# Patient Record
Sex: Male | Born: 1965
Health system: Southern US, Community
[De-identification: ages and names within clinical notes are randomized; demographics above are authoritative.]

## PROBLEM LIST (undated history)

## (undated) DIAGNOSIS — N200 Calculus of kidney: Secondary | ICD-10-CM

---

## 2017-12-03 ENCOUNTER — Ambulatory Visit (HOSPITAL_COMMUNITY)
Admission: EM | Admit: 2017-12-03 | Discharge: 2017-12-03 | Disposition: A | Discharge status: Home / Self Care | Payer: Self-pay | Attending: Urgent Care | Admitting: Urgent Care

## 2017-12-03 ENCOUNTER — Encounter (HOSPITAL_COMMUNITY): Payer: Self-pay | Admitting: Emergency Medicine

## 2017-12-03 ENCOUNTER — Ambulatory Visit (INDEPENDENT_AMBULATORY_CARE_PROVIDER_SITE_OTHER): Payer: Self-pay

## 2017-12-03 DIAGNOSIS — F17290 Nicotine dependence, other tobacco product, uncomplicated: Secondary | ICD-10-CM | POA: Insufficient documentation

## 2017-12-03 DIAGNOSIS — R0789 Other chest pain: Secondary | ICD-10-CM | POA: Insufficient documentation

## 2017-12-03 DIAGNOSIS — R9431 Abnormal electrocardiogram [ECG] [EKG]: Secondary | ICD-10-CM

## 2017-12-03 DIAGNOSIS — I491 Atrial premature depolarization: Secondary | ICD-10-CM | POA: Insufficient documentation

## 2017-12-03 DIAGNOSIS — M25511 Pain in right shoulder: Secondary | ICD-10-CM | POA: Insufficient documentation

## 2017-12-03 DIAGNOSIS — R109 Unspecified abdominal pain: Secondary | ICD-10-CM

## 2017-12-03 DIAGNOSIS — Z87442 Personal history of urinary calculi: Secondary | ICD-10-CM | POA: Insufficient documentation

## 2017-12-03 DIAGNOSIS — N200 Calculus of kidney: Secondary | ICD-10-CM

## 2017-12-03 HISTORY — DX: Calculus of kidney: N20.0

## 2017-12-03 LAB — POCT URINALYSIS DIP (DEVICE)
BILIRUBIN URINE: NEGATIVE
Glucose, UA: NEGATIVE mg/dL
Ketones, ur: NEGATIVE mg/dL
LEUKOCYTES UA: NEGATIVE
NITRITE: NEGATIVE
Protein, ur: 100 mg/dL — AB
Specific Gravity, Urine: 1.015 (ref 1.005–1.030)
Urobilinogen, UA: 0.2 mg/dL (ref 0.0–1.0)
pH: 6 (ref 5.0–8.0)

## 2017-12-03 LAB — POCT I-STAT, CHEM 8
BUN: 8 mg/dL (ref 6–20)
CHLORIDE: 102 mmol/L (ref 101–111)
Calcium, Ion: 1.02 mmol/L — ABNORMAL LOW (ref 1.15–1.40)
Creatinine, Ser: 0.9 mg/dL (ref 0.61–1.24)
Glucose, Bld: 89 mg/dL (ref 65–99)
HEMATOCRIT: 54 % — AB (ref 39.0–52.0)
Hemoglobin: 18.4 g/dL — ABNORMAL HIGH (ref 13.0–17.0)
POTASSIUM: 4 mmol/L (ref 3.5–5.1)
SODIUM: 135 mmol/L (ref 135–145)
TCO2: 23 mmol/L (ref 22–32)

## 2017-12-03 MED ORDER — HYDROCODONE-ACETAMINOPHEN 5-325 MG PO TABS: 2.0000 | ORAL_TABLET | Freq: Four times a day (QID) | ORAL | 0 refills | Status: DC | PRN

## 2017-12-03 MED ORDER — TAMSULOSIN HCL 0.4 MG PO CAPS: 0.4000 mg | ORAL_CAPSULE | Freq: Every day | ORAL | 1 refills | Status: DC

## 2017-12-03 NOTE — Discharge Instructions (Addendum)
Make sure you check in with your PCP about a referral to urology for evaluation of kidney stones. They should also help you with blood work for your liver and gallbladder enzymes. Your pain may be related to gall stones or your liver. For now, make sure you hydrate very well with at least 2 liters (1 gallon) of water daily. Use Flomax to help you urinate better. Take hydrocodone for severe pain.

## 2017-12-03 NOTE — ED Provider Notes (Addendum)
MRN: [id number] DOB: 1966-10-04  Subjective:   Alex Glover is a 52 y.o. male presenting for 2 day history of right flank pain. Pain is sharp, intermittent, worse with movement, talking, deep breaths, is rated ~8/10. Also has upper right chest/shoulder pain, rated ~6/10. Chest pain feels like a spasm type sensation. Has had sweats, chills, shakes. Has tried ibuprofen with some relief. Denies fever, n/v, abdominal pain, dysuria, hematuria. Admits history of renal stone ~18cm in left kidney, found 2015. Has a urologist, was supposed to have lithotripsy performed but he did not follow up. Smokes cigars occasionally. Has occasional alcohol drink. Hydrates aggressively. Denies family history of heart disease, MI. Denies history of diabetes. Alex Glover controlled substance database does not show patient to be high risk.  Alex Glover has No Known Allergies.  Alex Glover  has a past medical history of Kidney stone. Denies past surgical history.  Objective:   Vitals: BP 134/89   Pulse 97   Temp 99.6 F (37.6 C) (Oral)   Resp 16   Wt 195 lb (88.5 kg)   SpO2 100%   Physical Exam  Constitutional: He is oriented to person, place, and time. He appears well-developed and well-nourished.  HENT:  Mouth/Throat: Oropharynx is clear and moist.  Cardiovascular: Normal rate, regular rhythm and intact distal pulses. Exam reveals no gallop and no friction rub.  No murmur heard. Pulmonary/Chest: No respiratory distress. He has no wheezes. He has no rales.  Abdominal: Soft. Bowel sounds are normal. He exhibits no distension and no mass. There is no tenderness. There is no guarding.  Right flank pain. CVA tenderness.  Neurological: He is alert and oriented to person, place, and time.  Skin: Skin is warm and dry.   Dg Abd 1 View  Result Date: 12/03/2017 CLINICAL DATA:  52 year old male with right flank pain for 3 days. Known left nephrolithiasis. EXAM: ABDOMEN - 1 VIEW COMPARISON:  None. FINDINGS: Two supine views. Clustered  calculi projecting over the left renal shadow individually measure up to 13 millimeters in diameter. No definite right nephrolithiasis. The right renal contour appears normal. Negative visible lung bases and nonobstructed bowel-gas pattern. No osseous abnormality identified. IMPRESSION: 1. Clustered and bulky left nephrolithiasis. No right side urologic calculus identified radiographically. 2. Normal bowel gas pattern. Electronically Signed   By: Genevie Ann M.D.   On: 12/03/2017 12:41   Results for orders placed or performed during the hospital encounter of 12/03/17 (from the past 24 hour(s))  POCT urinalysis dip (device)     Status: Abnormal   Collection Time: 12/03/17 11:27 AM  Result Value Ref Range   Glucose, UA NEGATIVE NEGATIVE mg/dL   Bilirubin Urine NEGATIVE NEGATIVE   Ketones, ur NEGATIVE NEGATIVE mg/dL   Specific Gravity, Urine 1.015 1.005 - 1.030   Hgb urine dipstick TRACE (A) NEGATIVE   pH 6.0 5.0 - 8.0   Protein, ur 100 (A) NEGATIVE mg/dL   Urobilinogen, UA 0.2 0.0 - 1.0 mg/dL   Nitrite NEGATIVE NEGATIVE   Leukocytes, UA NEGATIVE NEGATIVE  I-STAT, chem 8     Status: Abnormal   Collection Time: 12/03/17 12:24 PM  Result Value Ref Range   Sodium 135 135 - 145 mmol/L   Potassium 4.0 3.5 - 5.1 mmol/L   Chloride 102 101 - 111 mmol/L   BUN 8 6 - 20 mg/dL   Creatinine, Ser 0.90 0.61 - 1.24 mg/dL   Glucose, Bld 89 65 - 99 mg/dL   Calcium, Ion 1.02 (L) 1.15 - 1.40 mmol/L  TCO2 23 22 - 32 mmol/L   Hemoglobin 18.4 (H) 13.0 - 17.0 g/dL   HCT 54.0 (H) 39.0 - 52.0 %   ED ECG REPORT   Date: 12/03/2017  Rate: 92bpm  Rhythm: normal sinus rhythm  QRS Axis: normal  Intervals: normal  ST/T Wave abnormalities: T-wave inversion in Lead III  Conduction Disutrbances:none  Narrative Interpretation: Non-specific T-wave inversion as above but in sinus rhythm without acute findings.  Old EKG Reviewed: none available  I have personally reviewed the EKG tracing and agree with the computerized  printout as noted.   Assessment and Plan :   Flank pain  Renal stone  Atypical chest pain  Nonspecific abnormal electrocardiogram (ECG) (EKG)  Case discussed with Dr. Joseph Art.  Will manage for possible renal stone. I counseled patient that CT renal study or ultrasound is best for identifying a renal stone but he will try to obtain referral to urology from his PCP to evaluate this. Also counseled that he needs to have LFTs done to r/o gallbladder, liver as a source for his pain. Also counseled that he needs follow up for his abnormal ecg. Return-to-clinic precautions discussed, patient verbalized understanding.     Jaynee Eagles, PA-C 12/03/17 1308

## 2017-12-03 NOTE — ED Triage Notes (Signed)
PT reports right flank pain for 2 days. PT has had a kidney stone in the past. PT reports urine has been darker. PT also reports pain over right  Chest. PT report pain with deep breaths.

## 2017-12-04 LAB — URINE CULTURE: Culture: NO GROWTH

## 2020-02-25 ENCOUNTER — Ambulatory Visit: Payer: Self-pay | Attending: Internal Medicine

## 2020-02-25 DIAGNOSIS — Z23 Encounter for immunization: Secondary | ICD-10-CM

## 2020-02-25 NOTE — Progress Notes (Signed)
   Covid-19 Vaccination Clinic  Name:  Alex Glover    MRN: [id number] DOB: 1966-10-05  02/25/2020  [name] was observed post Covid-19 immunization for 15 minutes without incident. He was provided with Vaccine Information Sheet and instruction to access the V-Safe system.   [name] was instructed to call 911 with any severe reactions post vaccine: Marland Kitchen Difficulty breathing  . Swelling of face and throat  . A fast heartbeat  . A bad rash all over body  . Dizziness and weakness   Immunizations Administered    Name Date Dose VIS Date Route   Pfizer COVID-19 Vaccine 02/25/2020  4:35 PM 0.3 mL 12/10/2018 Intramuscular   Manufacturer: [place]   Lot: [id number]   [place]: [id number]

## 2020-03-22 ENCOUNTER — Ambulatory Visit: Payer: Self-pay | Attending: Internal Medicine

## 2020-03-22 DIAGNOSIS — Z23 Encounter for immunization: Secondary | ICD-10-CM

## 2020-03-22 NOTE — Progress Notes (Signed)
   Covid-19 Vaccination Clinic  Name:  Alex Glover    MRN: [id number] DOB: 1966-09-11  03/22/2020  [name] was observed post Covid-19 immunization for 15 minutes without incident. He was provided with Vaccine Information Sheet and instruction to access the V-Safe system.   [name] was instructed to call 911 with any severe reactions post vaccine: Marland Kitchen Difficulty breathing  . Swelling of face and throat  . A fast heartbeat  . A bad rash all over body  . Dizziness and weakness   Immunizations Administered    Name Date Dose VIS Date Route   Pfizer COVID-19 Vaccine 03/22/2020 10:49 AM 0.3 mL 12/10/2018 Intramuscular   Manufacturer: [employer], [employer]   Lot: [id number]   [place]: [id number]

## 2020-03-22 NOTE — Progress Notes (Signed)
   Covid-19 Vaccination Clinic  Name:  Alex Glover    MRN: [id number] DOB: March 22, 1966  03/22/2020  [name] was observed post Covid-19 immunization for 15 minutes without incident. He was provided with Vaccine Information Sheet and instruction to access the V-Safe system.   [name] was instructed to call 911 with any severe reactions post vaccine: Marland Kitchen Difficulty breathing  . Swelling of face and throat  . A fast heartbeat  . A bad rash all over body  . Dizziness and weakness   Immunizations Administered    Name Date Dose VIS Date Route   Pfizer COVID-19 Vaccine 03/22/2020 10:49 AM 0.3 mL 12/10/2018 Intramuscular   Manufacturer: [employer], [employer]   Lot: [id number]   [place]: [id number]

## 2020-10-22 ENCOUNTER — Ambulatory Visit: Payer: Self-pay | Attending: Internal Medicine

## 2020-10-22 DIAGNOSIS — Z23 Encounter for immunization: Secondary | ICD-10-CM

## 2020-10-22 NOTE — Progress Notes (Signed)
   Covid-19 Vaccination Clinic  Name:  Alex Glover    MRN: [id number] DOB: May 21, 1966  10/22/2020  Alex Glover was observed post Covid-19 immunization for 15 minutes without incident. He was provided with Vaccine Information Sheet and instruction to access the V-Safe system.   Alex Glover was instructed to call 911 with any severe reactions post vaccine: Marland Kitchen Difficulty breathing  . Swelling of face and throat  . A fast heartbeat  . A bad rash all over body  . Dizziness and weakness   Immunizations Administered    Name Date Dose VIS Date Route   Pfizer COVID-19 Vaccine 10/22/2020  5:15 PM 0.3 mL 08/04/2020 Intramuscular   Manufacturer: [place]   Lot: [id number]   [place]: [address]-1

## 2020-11-04 ENCOUNTER — Ambulatory Visit (HOSPITAL_COMMUNITY)
Admission: EM | Admit: 2020-11-04 | Discharge: 2020-11-04 | Disposition: A | Discharge status: Home / Self Care | Payer: Medicare Other

## 2020-11-04 ENCOUNTER — Encounter (HOSPITAL_COMMUNITY): Payer: Self-pay

## 2020-11-04 ENCOUNTER — Emergency Department (HOSPITAL_BASED_OUTPATIENT_CLINIC_OR_DEPARTMENT_OTHER): Payer: Medicare Other

## 2020-11-04 ENCOUNTER — Other Ambulatory Visit: Payer: Self-pay

## 2020-11-04 ENCOUNTER — Emergency Department (HOSPITAL_COMMUNITY): Payer: Medicare Other

## 2020-11-04 ENCOUNTER — Inpatient Hospital Stay (HOSPITAL_COMMUNITY)
Admission: EM | Admit: 2020-11-04 | Discharge: 2020-11-06 | DRG: 176 | Disposition: A | Discharge status: Home / Self Care | Payer: Medicare Other | Attending: Internal Medicine | Admitting: Internal Medicine

## 2020-11-04 DIAGNOSIS — M7989 Other specified soft tissue disorders: Secondary | ICD-10-CM | POA: Diagnosis not present

## 2020-11-04 DIAGNOSIS — Z20822 Contact with and (suspected) exposure to covid-19: Secondary | ICD-10-CM | POA: Diagnosis present

## 2020-11-04 DIAGNOSIS — F17200 Nicotine dependence, unspecified, uncomplicated: Secondary | ICD-10-CM | POA: Diagnosis present

## 2020-11-04 DIAGNOSIS — I82462 Acute embolism and thrombosis of left calf muscular vein: Secondary | ICD-10-CM | POA: Diagnosis not present

## 2020-11-04 DIAGNOSIS — I82452 Acute embolism and thrombosis of left peroneal vein: Secondary | ICD-10-CM | POA: Diagnosis present

## 2020-11-04 DIAGNOSIS — R0989 Other specified symptoms and signs involving the circulatory and respiratory systems: Secondary | ICD-10-CM | POA: Diagnosis not present

## 2020-11-04 DIAGNOSIS — R55 Syncope and collapse: Secondary | ICD-10-CM | POA: Diagnosis present

## 2020-11-04 DIAGNOSIS — I82432 Acute embolism and thrombosis of left popliteal vein: Secondary | ICD-10-CM | POA: Diagnosis present

## 2020-11-04 DIAGNOSIS — I2609 Other pulmonary embolism with acute cor pulmonale: Secondary | ICD-10-CM | POA: Diagnosis not present

## 2020-11-04 DIAGNOSIS — R609 Edema, unspecified: Secondary | ICD-10-CM

## 2020-11-04 DIAGNOSIS — E041 Nontoxic single thyroid nodule: Secondary | ICD-10-CM | POA: Diagnosis present

## 2020-11-04 DIAGNOSIS — I2699 Other pulmonary embolism without acute cor pulmonale: Principal | ICD-10-CM | POA: Diagnosis present

## 2020-11-04 DIAGNOSIS — I82409 Acute embolism and thrombosis of unspecified deep veins of unspecified lower extremity: Secondary | ICD-10-CM | POA: Diagnosis present

## 2020-11-04 DIAGNOSIS — I248 Other forms of acute ischemic heart disease: Secondary | ICD-10-CM | POA: Diagnosis not present

## 2020-11-04 DIAGNOSIS — I10 Essential (primary) hypertension: Secondary | ICD-10-CM | POA: Diagnosis present

## 2020-11-04 DIAGNOSIS — I824Y2 Acute embolism and thrombosis of unspecified deep veins of left proximal lower extremity: Secondary | ICD-10-CM | POA: Diagnosis not present

## 2020-11-04 LAB — CBC
HCT: 44.8 % (ref 39.0–52.0)
Hemoglobin: 16 g/dL (ref 13.0–17.0)
MCH: 36.2 pg — ABNORMAL HIGH (ref 26.0–34.0)
MCHC: 35.7 g/dL (ref 30.0–36.0)
MCV: 101.4 fL — ABNORMAL HIGH (ref 80.0–100.0)
Platelets: 152 10*3/uL (ref 150–400)
RBC: 4.42 MIL/uL (ref 4.22–5.81)
RDW: 12.2 % (ref 11.5–15.5)
WBC: 7.8 10*3/uL (ref 4.0–10.5)
nRBC: 0 % (ref 0.0–0.2)

## 2020-11-04 LAB — TROPONIN I (HIGH SENSITIVITY)
Troponin I (High Sensitivity): 88 ng/L — ABNORMAL HIGH (ref <18)
Troponin I (High Sensitivity): 70 ng/L — ABNORMAL HIGH (ref <18)

## 2020-11-04 LAB — BASIC METABOLIC PANEL
Anion gap: 10 (ref 5–15)
BUN: 9 mg/dL (ref 6–20)
CO2: 25 mmol/L (ref 22–32)
Calcium: 9.4 mg/dL (ref 8.9–10.3)
Chloride: 101 mmol/L (ref 98–111)
Creatinine, Ser: 1.18 mg/dL (ref 0.61–1.24)
GFR, Estimated: >60 mL/min (ref >60)
Glucose, Bld: 94 mg/dL (ref 70–99)
Potassium: 3.8 mmol/L (ref 3.5–5.1)
Sodium: 136 mmol/L (ref 135–145)

## 2020-11-04 LAB — HIV ANTIBODY (ROUTINE TESTING W REFLEX): HIV Screen 4th Generation wRfx: NONREACTIVE

## 2020-11-04 LAB — SARS CORONAVIRUS 2 (TAT 6-24 HRS): SARS Coronavirus 2: NEGATIVE

## 2020-11-04 LAB — HEPARIN LEVEL (UNFRACTIONATED): Heparin Unfractionated: 0.46 IU/mL (ref 0.30–0.70)

## 2020-11-04 LAB — C-REACTIVE PROTEIN: CRP: 3.8 mg/dL — ABNORMAL HIGH (ref <1.0)

## 2020-11-04 MED ORDER — ALBUTEROL SULFATE HFA 108 (90 BASE) MCG/ACT IN AERS
2.0000 | INHALATION_SPRAY | Freq: Four times a day (QID) | RESPIRATORY_TRACT | Status: DC | PRN
  Filled 2020-11-04: qty 6.7

## 2020-11-04 MED ORDER — IOHEXOL 350 MG/ML SOLN
50.0000 mL | Freq: Once | INTRAVENOUS | Status: AC | PRN
  Administered 2020-11-04: 50 mL via INTRAVENOUS

## 2020-11-04 MED ORDER — HYDRALAZINE HCL 20 MG/ML IJ SOLN: 10.0000 mg | Freq: Four times a day (QID) | INTRAMUSCULAR | Status: DC | PRN

## 2020-11-04 MED ORDER — METHOCARBAMOL 1000 MG/10ML IJ SOLN
500.0000 mg | Freq: Four times a day (QID) | INTRAVENOUS | Status: DC | PRN
  Filled 2020-11-04: qty 5

## 2020-11-04 MED ORDER — HEPARIN (PORCINE) 25000 UT/250ML-% IV SOLN
1400.0000 [IU]/h | INTRAVENOUS | Status: AC
  Administered 2020-11-04 – 2020-11-05 (×3): 1400 [IU]/h via INTRAVENOUS
  Filled 2020-11-04 (×3): qty 250

## 2020-11-04 MED ORDER — ONDANSETRON HCL 4 MG/2ML IJ SOLN: 4.0000 mg | Freq: Four times a day (QID) | INTRAMUSCULAR | Status: DC | PRN

## 2020-11-04 MED ORDER — HEPARIN BOLUS VIA INFUSION
5000.0000 [IU] | Freq: Once | INTRAVENOUS | Status: AC
  Administered 2020-11-04: 5000 [IU] via INTRAVENOUS
  Filled 2020-11-04: qty 5000

## 2020-11-04 MED ORDER — BISACODYL 5 MG PO TBEC: 5.0000 mg | DELAYED_RELEASE_TABLET | Freq: Every day | ORAL | Status: DC | PRN

## 2020-11-04 MED ORDER — AMLODIPINE BESYLATE 10 MG PO TABS
10.0000 mg | ORAL_TABLET | Freq: Every day | ORAL | Status: DC
  Administered 2020-11-05 – 2020-11-06 (×2): 10 mg via ORAL
  Filled 2020-11-04: qty 2
  Filled 2020-11-04 (×2): qty 1

## 2020-11-04 MED ORDER — ACETAMINOPHEN 325 MG PO TABS
650.0000 mg | ORAL_TABLET | Freq: Four times a day (QID) | ORAL | Status: DC | PRN
  Administered 2020-11-04 – 2020-11-05 (×2): 650 mg via ORAL
  Filled 2020-11-04 (×2): qty 2

## 2020-11-04 MED ORDER — SODIUM CHLORIDE 0.45 % IV SOLN: INTRAVENOUS | Status: DC

## 2020-11-04 MED ORDER — TAMSULOSIN HCL 0.4 MG PO CAPS
0.4000 mg | ORAL_CAPSULE | Freq: Every day | ORAL | Status: DC
  Filled 2020-11-04 (×3): qty 1

## 2020-11-04 NOTE — ED Provider Notes (Signed)
[name]    CSN: [id number] Arrival date & time: 11/04/20  1062      History   Chief Complaint Chief Complaint  Patient presents with  . Leg Pain    HPI Alex Glover is a 55 y.o. male presenting with bilateral leg pain L>R for 1 week. States he had his covid vaccine 1 week ago. Also states that he was hit in the left leg by tree branch 1 month ago, and did have calf pain after that, but this resolved for 2 weeks before current pain. Pt describes 1 week of crampy left calf pain and swelilng, getting worse. Pain has travelled up to just past his knee. Pain is worse with walking and reclining with legs up, like when he's lying in bed. Has noticed few days of more mild R calf pain. Notes 2-3 days of "breathing heavily", particularly while ambulating, though he denies shortness of breath or chest pain. No history of DVT or PE in the past. No recent travel or prolonged immobilization.   HPI  Past Medical History:  Diagnosis Date  . Kidney stone     There are no problems to display for this patient.   No past surgical history on file.     Home Medications    Prior to Admission medications   Medication Sig Start Date End Date Taking? Authorizing Provider  HYDROcodone-acetaminophen (NORCO/VICODIN) 5-325 MG tablet Take 2 tablets by mouth every 6 (six) hours as needed for severe pain. 12/03/17   Jaynee Eagles, PA-C  ibuprofen (ADVIL,MOTRIN) 600 MG tablet Take 600 mg by mouth every 6 (six) hours as needed.    [provider]  tamsulosin (FLOMAX) 0.4 MG CAPS capsule Take 1 capsule (0.4 mg total) by mouth daily. 12/03/17   Jaynee Eagles, PA-C    Family History No family history on file.  Social History Social History   Tobacco Use  . Smoking status: Current Some Day Smoker  . Smokeless tobacco: Never Used  Substance Use Topics  . Alcohol use: No  . Drug use: No     Allergies   Patient has no known allergies.   Review of Systems Review of Systems   Musculoskeletal:       Left calf pain   All other systems reviewed and are negative.    Physical Exam Triage Vital Signs ED Triage Vitals  Enc Vitals Group     BP 11/04/20 0903 132/90     Pulse Rate 11/04/20 0903 84     Resp 11/04/20 0903 18     Temp 11/04/20 0903 98.6 F (37 C)     Temp Source 11/04/20 0903 Oral     SpO2 11/04/20 0903 95 %     Weight --      Height --      Head Circumference --      Peak Flow --      Pain Score 11/04/20 0901 10     Pain Loc --      Pain Edu? --      Excl. in [place]? --    No data found.  Updated Vital Signs BP 132/90 (BP Location: Left Arm)   Pulse 84   Temp 98.6 F (37 C) (Oral)   Resp 18   SpO2 95%   Visual Acuity Right Eye Distance:   Left Eye Distance:   Bilateral Distance:    Right Eye Near:   Left Eye Near:    Bilateral Near:     Physical  Exam Vitals reviewed.  Constitutional:      Appearance: Normal appearance.  Cardiovascular:     Rate and Rhythm: Normal rate and regular rhythm.     Heart sounds: Normal heart sounds.  Pulmonary:     Effort: Pulmonary effort is normal. No tachypnea.     Breath sounds: Normal breath sounds. No decreased breath sounds, wheezing, rhonchi or rales.  Musculoskeletal:     Right lower leg: No swelling or tenderness.     Left lower leg: Swelling and tenderness present.     Comments: L calf with swelling and tenderness. Positive Homan sign. R calf without swelling or tenderness but with positive Homan sign. No knee or ankle tenderness, crepitus, decreased ROM, etc.   Neurological:     General: No focal deficit present.     Mental Status: He is alert and oriented to person, place, and time.  Psychiatric:        Mood and Affect: Mood normal.        Behavior: Behavior normal.        Thought Content: Thought content normal.        Judgment: Judgment normal.      UC Treatments / Results  Labs (all labs ordered are listed, but only abnormal results are displayed) Labs Reviewed - No  data to display  EKG   Radiology No results found.  Procedures Procedures (including critical care time)  Medications Ordered in UC Medications - No data to display  Initial Impression / Assessment and Plan / UC Course  I have reviewed the triage vital signs and the nursing notes.  Pertinent labs & imaging results that were available during my care of the patient were reviewed by me and considered in my medical decision making (see chart for details).     I have a high clinical suspicion for DVT based on this pt's presentation, particularly given recent vaccination for covid that he received immediately before onset of symptoms. Pt is hemodynamically stable for transport by family member in their vehicle. He verbalizes that he will head straight to [facility] ED. He will head straight to [facility] ED for further evaluation.   Final Clinical Impressions(s) / UC Diagnoses   Final diagnoses:  Suspected DVT (deep vein thrombosis)     Discharge Instructions     -Head straight to [facility] ED for further evaluation of calf pain     ED Prescriptions    None     PDMP not reviewed this encounter.   Hazel Sams, PA-C 11/04/20 [phone]

## 2020-11-04 NOTE — Progress Notes (Signed)
ANTICOAGULATION CONSULT NOTE - Initial Consult  Pharmacy Consult for heparin Indication: DVT  No Known Allergies  Patient Measurements: Height: 6\' 1"  (185.4 cm) Weight: 86.2 kg (190 lb) IBW/kg (Calculated) : 79.9 Heparin Dosing Weight: TBW  Vital Signs: Temp: 98.5 F (36.9 C) (01/20 1420) Temp Source: Oral (01/20 1420) BP: 125/90 (01/20 1420) Pulse Rate: 80 (01/20 1420)  Labs: Recent Labs    11/04/20 0949  HGB 16.0  HCT 44.8  PLT 152  CREATININE 1.18  TROPONINIHS 88*    Estimated Creatinine Clearance: 82.8 mL/min (by C-G formula based on SCr of 1.18 mg/dL).   Medical History: Past Medical History:  Diagnosis Date  . Kidney stone    Assessment: 48 YOM presenting with calf pain, doppler + for DVT L-popliteal/peroneal, also with SOB.  Not on anticoagulation PTA, CBC wnl.    Goal of Therapy:  Heparin level 0.3-0.7 units/ml Monitor platelets by anticoagulation protocol: Yes   Plan:  Heparin 5000 units IV x 1, and gtt at 1400 units/hr F/u 6 hour heparin level, CT angiogram results with PE workup F/u long term AC plan and ability to switch to PO  Bertis Ruddy, PharmD Clinical Pharmacist ED Pharmacist Phone # [phone] 11/04/2020 3:21 PM

## 2020-11-04 NOTE — ED Triage Notes (Signed)
Pt reports Tuesday he had a syncopal episode and he was walking into the house and got dizzy. Pt reports he woke up on the ground. Pt states he didn't head his head at the time of the event.Pt reports he went to sleep and woke up sweating. Pt reports right calf is swollen.  And was sent by UC for [place].

## 2020-11-04 NOTE — ED Provider Notes (Signed)
[facility] EMERGENCY DEPARTMENT Provider Note   CSN: [id number] Arrival date & time: 11/04/20  [id number]     History Chief Complaint  Patient presents with  . Near Syncope    Alex Glover is a 55 y.o. male.  HPI   Patient had an episode of syncope couple days ago as well as calf pain.  Patient indicated he was having left greater than right calf pain ongoing for over a week.  Patient's pain is worse with activity.  Patient also noticed for the last few days he was breathing more heavily when walking.  Patient also had near fainting spell a couple days ago.  Patient went to the urgent care today.  They were concerned about possible DVT so he was sent to the ED for further evaluation.  Patient denies feeling short of breath right now at rest.  He has not had any fevers.  Diarrhea.  Past Medical History:  Diagnosis Date  . Kidney stone     There are no problems to display for this patient.   History reviewed. No pertinent surgical history.     History reviewed. No pertinent family history.  Social History   Tobacco Use  . Smoking status: Current Some Day Smoker  . Smokeless tobacco: Never Used  Substance Use Topics  . Alcohol use: No  . Drug use: No    Home Medications Prior to Admission medications   Medication Sig Start Date End Date Taking? Authorizing Provider  HYDROcodone-acetaminophen (NORCO/VICODIN) 5-325 MG tablet Take 2 tablets by mouth every 6 (six) hours as needed for severe pain. 12/03/17   Jaynee Eagles, PA-C  ibuprofen (ADVIL,MOTRIN) 600 MG tablet Take 600 mg by mouth every 6 (six) hours as needed.    [provider]  tamsulosin (FLOMAX) 0.4 MG CAPS capsule Take 1 capsule (0.4 mg total) by mouth daily. 12/03/17   Jaynee Eagles, PA-C    Allergies    Patient has no known allergies.  Review of Systems   Review of Systems  All other systems reviewed and are negative.   Physical Exam Updated Vital Signs BP 125/90 (BP Location:  Right Arm)   Pulse 80   Temp 98.5 F (36.9 C) (Oral)   Resp 16   Ht 1.854 m (6\' 1" )   Wt 86.2 kg   SpO2 96%   BMI 25.07 kg/m   Physical Exam Vitals and nursing note reviewed.  Constitutional:      General: He is not in acute distress.    Appearance: He is well-developed and well-nourished.  HENT:     Head: Normocephalic and atraumatic.     Right Ear: External ear normal.     Left Ear: External ear normal.  Eyes:     General: No scleral icterus.       Right eye: No discharge.        Left eye: No discharge.     Conjunctiva/sclera: Conjunctivae normal.  Neck:     Trachea: No tracheal deviation.  Cardiovascular:     Rate and Rhythm: Normal rate and regular rhythm.     Pulses: Intact distal pulses.  Pulmonary:     Effort: Pulmonary effort is normal. No respiratory distress.     Breath sounds: Normal breath sounds. No stridor. No wheezing or rales.  Abdominal:     General: Bowel sounds are normal. There is no distension.     Palpations: Abdomen is soft.     Tenderness: There is no abdominal tenderness. There is  no guarding or rebound.  Musculoskeletal:        General: No tenderness or edema.     Cervical back: Neck supple.     Comments: Left calf tenderness  Skin:    General: Skin is warm and dry.     Findings: No rash.  Neurological:     Mental Status: He is alert.     Cranial Nerves: No cranial nerve deficit (no facial droop, extraocular movements intact, no slurred speech).     Sensory: No sensory deficit.     Motor: No abnormal muscle tone or seizure activity.     Coordination: Coordination normal.     Deep Tendon Reflexes: Strength normal.  Psychiatric:        Mood and Affect: Mood and affect normal.     ED Results / Procedures / Treatments   Labs (all labs ordered are listed, but only abnormal results are displayed) Labs Reviewed  CBC - Abnormal; Notable for the following components:      Result Value   MCV 101.4 (*)    MCH 36.2 (*)    All other  components within normal limits  TROPONIN I (HIGH SENSITIVITY) - Abnormal; Notable for the following components:   Troponin I (High Sensitivity) 88 (*)    All other components within normal limits  BASIC METABOLIC PANEL  TROPONIN I (HIGH SENSITIVITY)    EKG EKG Interpretation  Date/Time:  Thursday November 04 2020 09:38:35 EST Ventricular Rate:  81 PR Interval:  132 QRS Duration: 80 QT Interval:  380 QTC Calculation: 441 R Axis:   35 Text Interpretation: Normal sinus rhythm Right atrial enlargement No significant change since last tracing Confirmed by Blanchie Dessert [phone]) on 11/04/2020 3:07:28 PM   Radiology DG Chest 2 View  Result Date: 11/04/2020 CLINICAL DATA:  Near syncope.  Chest pain. EXAM: CHEST - 2 VIEW COMPARISON:  No prior. FINDINGS: Mediastinum hilar structures normal. Mild right perihilar and left base subsegmental atelectasis and or scarring. Lungs are clear of acute infiltrates. No pleural effusion or pneumothorax. Heart size normal. No acute bony abnormality. IMPRESSION: Mild right perihilar and left base subsegmental atelectasis and or scarring. No acute infiltrate. Electronically Signed   By: Marcello Moores  Register   On: 11/04/2020 10:14   VAS [place] LOWER EXTREMITY VENOUS (DVT) (ONLY [facility] & [facility])  Result Date: 11/04/2020  Lower Venous DVT Study Indications: Swelling, and Edema.  Comparison Study: no prior Performing Technologist: Abram Sander RVS  Examination Guidelines: A complete evaluation includes B-mode imaging, spectral Doppler, color Doppler, and power Doppler as needed of all accessible portions of each vessel. Bilateral testing is considered an integral part of a complete examination. Limited examinations for reoccurring indications may be performed as noted. The reflux portion of the exam is performed with the patient in reverse Trendelenburg.  +-----+---------------+---------+-----------+----------+--------------+  RIGHTCompressibilityPhasicitySpontaneityPropertiesThrombus Aging +-----+---------------+---------+-----------+----------+--------------+ CFV  Full           Yes      Yes                                 +-----+---------------+---------+-----------+----------+--------------+   +---------+---------------+---------+-----------+----------+-----------------+ LEFT     CompressibilityPhasicitySpontaneityPropertiesThrombus Aging    +---------+---------------+---------+-----------+----------+-----------------+ CFV      Full           Yes      Yes                                    +---------+---------------+---------+-----------+----------+-----------------+  SFJ      Full                                                           +---------+---------------+---------+-----------+----------+-----------------+ FV Prox  Full                                                           +---------+---------------+---------+-----------+----------+-----------------+ FV Mid   Full                                                           +---------+---------------+---------+-----------+----------+-----------------+ FV DistalFull                                                           +---------+---------------+---------+-----------+----------+-----------------+ PFV      Full                                                           +---------+---------------+---------+-----------+----------+-----------------+ POP      None           No       No                   Age Indeterminate +---------+---------------+---------+-----------+----------+-----------------+ PTV      Full                                                           +---------+---------------+---------+-----------+----------+-----------------+ PERO     None                                         Age Indeterminate +---------+---------------+---------+-----------+----------+-----------------+      Summary: RIGHT: - No evidence of common femoral vein obstruction.  LEFT: - Findings consistent with age indeterminate deep vein thrombosis involving the left popliteal vein, and left peroneal veins. - There is no evidence of deep vein thrombosis in the lower extremity.  *See table(s) above for measurements and observations. Electronically signed by Servando Snare MD on 11/04/2020 at 2:27:11 PM.    Final     Procedures Procedures (including critical care time)  Medications Ordered in ED Medications - No data to display  ED Course  I have reviewed the triage vital signs and the nursing notes.  Pertinent labs & imaging results that were available during my care  of the patient were reviewed by me and considered in my medical decision making (see chart for details).    MDM Rules/Calculators/A&P                          Patient is Doppler study does show a DVT.  Patient also has an elevated troponin.  Patient has noticed some respiratory symptoms.  Concerned about the possibility of pulmonary embolism.  IV heparin has been started.  CT chest angiogram has been ordered.  Care turned over to Dr Maryan Rued  Final Clinical Impression(s) / ED Diagnoses Final diagnoses:  Acute deep vein thrombosis (DVT) of calf muscle vein of left lower extremity ([place])      Dorie Rank, MD 11/04/20 1514

## 2020-11-04 NOTE — Discharge Instructions (Addendum)
-  Head straight to [facility] ED for further evaluation of calf pain

## 2020-11-04 NOTE — Progress Notes (Signed)
Lower extremity venous has been completed.   Preliminary results in CV Proc.   Alex Glover 11/04/2020 11:27 AM

## 2020-11-04 NOTE — ED Triage Notes (Signed)
Pt reports pain, swelling, hard to touch in the right calf x 1 week. Pain worsens walking. States 2 days ago e was walking and passed out due to pain. Reports intermittent numbness and tingling in right big toe. Reports started having same symptoms in  right calf pain x 2-4 days.

## 2020-11-04 NOTE — ED Notes (Signed)
Patient is being discharged from the Urgent Care and sent to the Emergency Department via POV . Per Marin Roberts, patient is in need of higher level of care due to calf pain. Patient is aware and verbalizes understanding of plan of care.  Vitals:   11/04/20 0903  BP: 132/90  Pulse: 84  Resp: 18  Temp: 98.6 F (37 C)  SpO2: 95%

## 2020-11-04 NOTE — H&P (Signed)
TRH H&P   Patient Demographics:    Alex Glover, is a 55 y.o. male  MRN: [id number]   DOB - 10-21-1965  Admit Date - 11/04/2020  Outpatient Primary MD for the patient is System, Provider Not In    Patient coming from: Home  Chief Complaint  Patient presents with  . Near Syncope      HPI:    Alex Glover  is a 55 y.o. male, past medical history of kidney stone few years ago, no known medical problems except occasional alcohol intake and smoking, who had an injury to his left calf with a tree branch floundering over 2 weeks ago, he also had his third [place] booster shot about 2 weeks ago.  Since his left calf injury about 2 weeks ago he started noticing some left sided calf pain and discomfort while walking, subsequently he developed some shortness of breath and about 2 days ago after walking a short distance at home he passed out for a few minutes, his left calf pain and shortness of breath continued to get worse and came to the ER where he was diagnosed with acute PE with possible right heart strain on CT scan and we were called to admit the patient to the hospital.  He currently has exertional shortness of breath and ongoing left calf discomfort.  Patient denies any headache, no fever chills or sore throat, no runny nose, no exposure to sick contacts, no recent long drives or travels, no chest pain or palpitations, no abdominal discomfort, no blood in stool or urine, no focal weakness.  Sides above in HPI no other positive review of systems.    Review of systems:    A full 10 point Review of Systems was done, except as stated above, all other Review of Systems were negative.   With Past History of the  following :    Past Medical History:  Diagnosis Date  . Kidney stone       History reviewed. No pertinent surgical history.    Social History:     Social History   Tobacco Use  . Smoking status: Current Some Day Smoker  . Smokeless tobacco: Never Used  Substance Use Topics  . Alcohol use: No       Family History :   No history of significant malignancies or  DVT PE in the family   Home Medications:   Prior to Admission medications   Medication Sig Start Date End Date Taking? Authorizing Provider  HYDROcodone-acetaminophen (NORCO/VICODIN) 5-325 MG tablet Take 2 tablets by mouth every 6 (six) hours as needed for severe pain. 12/03/17   Jaynee Eagles, PA-C  ibuprofen (ADVIL,MOTRIN) 600 MG tablet Take 600 mg by mouth every 6 (six) hours as needed.    [provider]  tamsulosin (FLOMAX) 0.4 MG CAPS capsule Take 1 capsule (0.4 mg total) by mouth daily. 12/03/17   Jaynee Eagles, PA-C     Allergies:    No Known Allergies   Physical Exam:   Vitals  Blood pressure (!) 144/104, pulse 99, temperature 98.5 F (36.9 C), temperature source Oral, resp. rate (!) 22, height 6\' 1"  (1.854 m), weight 86.2 kg, SpO2 97 %.   1. General middle-aged thin African-American male lying in hospital bed in no apparent discomfort,  2. Normal affect and insight, Not Suicidal or Homicidal, Awake Alert,   3. No F.N deficits, ALL C.Nerves Intact, Strength 5/5 all 4 extremities, Sensation intact all 4 extremities, Plantars down going.  4. Ears and Eyes appear Normal, Conjunctivae clear, PERRLA. Moist Oral Mucosa.  5. Supple Neck, No JVD, No cervical lymphadenopathy appriciated, No Carotid Bruits.  6. Symmetrical Chest wall movement, Good air movement bilaterally, CTAB.  7. RRR, No Gallops, Rubs or Murmurs, No Parasternal Heave.  8. Positive Bowel Sounds, Abdomen Soft, No tenderness, No organomegaly appriciated,No rebound -guarding or rigidity.  9.  No Cyanosis, Normal Skin Turgor, No  Skin Rash or Bruise.  10. Good muscle tone,  joints appear normal , no effusions, Normal ROM.  11. No Palpable Lymph Nodes in Neck or Axillae, mild left leg swelling with some calf tenderness on slight pressure.      Data Review:    CBC Recent Labs  Lab 11/04/20 0949  WBC 7.8  HGB 16.0  HCT 44.8  PLT 152  MCV 101.4*  MCH 36.2*  MCHC 35.7  RDW 12.2   ------------------------------------------------------------------------------------------------------------------  Chemistries  Recent Labs  Lab 11/04/20 0949  NA 136  K 3.8  CL 101  CO2 25  GLUCOSE 94  BUN 9  CREATININE 1.18  CALCIUM 9.4   ------------------------------------------------------------------------------------------------------------------ estimated creatinine clearance is 82.8 mL/min (by C-G formula based on SCr of 1.18 mg/dL). ------------------------------------------------------------------------------------------------------------------ No results for input(s): TSH, T4TOTAL, T3FREE, THYROIDAB in the last 72 hours.  Invalid input(s): FREET3  Coagulation profile No results for input(s): INR, PROTIME in the last 168 hours. ------------------------------------------------------------------------------------------------------------------- No results for input(s): DDIMER in the last 72 hours. -------------------------------------------------------------------------------------------------------------------  Cardiac Enzymes No results for input(s): CKMB, TROPONINI, MYOGLOBIN in the last 168 hours.  Invalid input(s): CK ------------------------------------------------------------------------------------------------------------------ No results found for: BNP   ---------------------------------------------------------------------------------------------------------------  Urinalysis    Component Value Date/Time   LABSPEC 1.015 12/03/2017 1127   PHURINE 6.0 12/03/2017 1127   GLUCOSEU NEGATIVE  12/03/2017 1127   HGBUR TRACE (A) 12/03/2017 [place] 12/03/2017 1127   KETONESUR NEGATIVE 12/03/2017 1127   PROTEINUR 100 (A) 12/03/2017 1127   UROBILINOGEN 0.2 12/03/2017 1127   NITRITE NEGATIVE 12/03/2017 1127   LEUKOCYTESUR NEGATIVE 12/03/2017 1127    ----------------------------------------------------------------------------------------------------------------   Imaging Results:    DG Chest 2 View  Result Date: 11/04/2020 CLINICAL DATA:  Near syncope.  Chest pain. EXAM: CHEST - 2 VIEW COMPARISON:  No prior. FINDINGS: Mediastinum hilar structures normal. Mild right perihilar and left base subsegmental atelectasis and or scarring. Lungs are  clear of acute infiltrates. No pleural effusion or pneumothorax. Heart size normal. No acute bony abnormality. IMPRESSION: Mild right perihilar and left base subsegmental atelectasis and or scarring. No acute infiltrate. Electronically Signed   By: Marcello Moores  Register   On: 11/04/2020 10:14   CT Angio Chest PE W and/or Wo Contrast  Result Date: 11/04/2020 CLINICAL DATA:  Shortness of breath. High probability for acute pulmonary embolus. EXAM: CT ANGIOGRAPHY CHEST WITH CONTRAST TECHNIQUE: Multidetector CT imaging of the chest was performed using the standard protocol during bolus administration of intravenous contrast. Multiplanar CT image reconstructions and MIPs were obtained to evaluate the vascular anatomy. CONTRAST:  86mL OMNIPAQUE IOHEXOL 350 MG/ML SOLN COMPARISON:  None. FINDINGS: Cardiovascular: The heart size appears normal. No pericardial effusion. Large central obstructing pulmonary embolus is identified within the distal right main pulmonary artery extending into the right lower lobar and segmental pulmonary arteries. There also multiple clots extending into the segmental branches of the left upper lobe, lingula and left lower lobe. Signs of right heart strain is identified with RV to LV ratio of 1.19. Mediastinum/Nodes: Large  low-attenuation nodule within the right lobe of thyroid gland has a maximum dimension of 3.7 cm. Increased nodular soft tissue within the anterior mediastinum is identified within the thyroid bed, image 69/5. No axillary or supraclavicular adenopathy. No mediastinal or hilar adenopathy. Lungs/Pleura: No pleural effusion. Subsegmental atelectasis is identified within both lung bases. No pleural effusion, airspace consolidation or pneumothorax. No interstitial edema. Upper Abdomen: No acute abnormality. Musculoskeletal: No chest wall abnormality. No acute or significant osseous findings. Review of the MIP images confirms the above findings. IMPRESSION: 1. Examination is positive for acute pulmonary embolus with CT evidence of right heart strain (RV/LV Ratio 1.19) consistent with at least submassive (intermediate risk) PE. The presence of right heart strain has been associated with an increased risk of morbidity and mortality. 2. Large low-attenuation nodule within the right lobe of thyroid gland. Recommend thyroid US (ref: J Am Coll Radiol. 2015 Feb;12(2): 143-50). 3. Increased nodular soft tissue within the anterior mediastinum. Primary differential considerations include thymic nodular or lymphoid hyperplasia versus benign or malignant neoplasm. Recommend referral to thoracic surgery for further management. 4. Critical Value/emergent results were called by telephone at the time of interpretation on 11/04/2020 at 4:33 pm to provider Blanchie Dessert, who verbally acknowledged these results. Electronically Signed   By: Kerby Moors M.D.   On: 11/04/2020 16:34   VAS [place] LOWER EXTREMITY VENOUS (DVT) (ONLY [facility] & [facility])  Result Date: 11/04/2020  Lower Venous DVT Study Indications: Swelling, and Edema.  Comparison Study: no prior Performing Technologist: Abram Sander RVS  Examination Guidelines: A complete evaluation includes B-mode imaging, spectral Doppler, color Doppler, and power Doppler as needed of all accessible  portions of each vessel. Bilateral testing is considered an integral part of a complete examination. Limited examinations for reoccurring indications may be performed as noted. The reflux portion of the exam is performed with the patient in reverse Trendelenburg.  +-----+---------------+---------+-----------+----------+--------------+ RIGHTCompressibilityPhasicitySpontaneityPropertiesThrombus Aging +-----+---------------+---------+-----------+----------+--------------+ CFV  Full           Yes      Yes                                 +-----+---------------+---------+-----------+----------+--------------+   +---------+---------------+---------+-----------+----------+-----------------+ LEFT     CompressibilityPhasicitySpontaneityPropertiesThrombus Aging    +---------+---------------+---------+-----------+----------+-----------------+ CFV      Full  Yes      Yes                                    +---------+---------------+---------+-----------+----------+-----------------+ SFJ      Full                                                           +---------+---------------+---------+-----------+----------+-----------------+ FV Prox  Full                                                           +---------+---------------+---------+-----------+----------+-----------------+ FV Mid   Full                                                           +---------+---------------+---------+-----------+----------+-----------------+ FV DistalFull                                                           +---------+---------------+---------+-----------+----------+-----------------+ PFV      Full                                                           +---------+---------------+---------+-----------+----------+-----------------+ POP      None           No       No                   Age Indeterminate  +---------+---------------+---------+-----------+----------+-----------------+ PTV      Full                                                           +---------+---------------+---------+-----------+----------+-----------------+ PERO     None                                         Age Indeterminate +---------+---------------+---------+-----------+----------+-----------------+     Summary: RIGHT: - No evidence of common femoral vein obstruction.  LEFT: - Findings consistent with age indeterminate deep vein thrombosis involving the left popliteal vein, and left peroneal veins. - There is no evidence of deep vein thrombosis in the lower extremity.  *See table(s) above for measurements and observations. Electronically signed by Servando Snare MD on 11/04/2020 at 2:27:11 PM.    Final     My  personal review of EKG: Rhythm NSR, no acute ST changes.   Assessment & Plan:      1.  Acute right distal main pulmonary artery PE, acute left lower extremity DVT with radiological evidence of right heart strain.  This likely precipitated by injury to the left calf 2 weeks ago coinciding with [place] booster shot around the same time.  He is hemodynamically stable and has no hypoxia, he is started on heparin drip which will be continued, will check echocardiogram, will be admitted to telemetry progressive care unit plan will be IV anticoagulation for 24 hours thereafter Eliquis or Xarelto for at least 9 months.  He is completely hemodynamically stable.  Has nonsignificant mild rise in troponin and non-ACS pattern due to RV strain from PE.  Check echocardiogram to evaluate wall motion, no further work-up at this time  2. Possible undiagnosed hypertension.  Started on Norvasc and as needed hydralazine.    Pending COVID test, kindly follow along with CRP.  Low likelihood of asymptomatic COVID infection causing inflammation and clot.   DVT Prophylaxis Heparin    AM Labs Ordered, also please review Full  Orders  Family Communication: Admission, patients condition and plan of care including tests being ordered have been discussed with the patient  who indicates understanding and agree with the plan and Code Status.  Code Status Full  Likely DC to  Home  Condition Fair  Consults called: None    Admission status: Inpt    Time spent in minutes : 35   Lala Lund M.D on 11/04/2020 at 5:30 PM  To page go to [email or web address] - password [facility]

## 2020-11-04 NOTE — ED Provider Notes (Signed)
Assumed care of patient from Dr. Tomi Bamberger at 3 PM.  Patient with left calf pain for approximately 3 to 4 weeks with worsening pain and then noticing symptoms of shortness of breath with exertion and a syncopal event.  Patient does have ultrasound that shows evidence of left-sided DVT however with other complaints concern for PE.  CTA today does show bilateral PE with right heart strain and mild troponin leak of 88 and then 70.  Patient currently is in no acute distress and satting normally on room air.  He was started on IV heparin and will admit for further care.  No prior history of PE or DVT.  Reports everything started after injuring his leg when it was hit by a tree branch at the end of December.    Blanchie Dessert, MD 11/04/20 1626

## 2020-11-04 NOTE — Progress Notes (Signed)
ANTICOAGULATION CONSULT NOTE  Pharmacy Consult for heparin Indication: DVT  No Known Allergies  Patient Measurements: Height: 6\' 1"  (185.4 cm) Weight: 86.2 kg (190 lb) IBW/kg (Calculated) : 79.9 Heparin Dosing Weight: 86.2 kg  Vital Signs: Temp: 98.1 F (36.7 C) (01/20 2115) Temp Source: Oral (01/20 2115) BP: 131/96 (01/20 1906) Pulse Rate: 88 (01/20 1906)  Labs: Recent Labs    11/04/20 0949 11/04/20 1505 11/04/20 2119  HGB 16.0  --   --   HCT 44.8  --   --   PLT 152  --   --   HEPARINUNFRC  --   --  0.46  CREATININE 1.18  --   --   TROPONINIHS 88* 70*  --     Estimated Creatinine Clearance: 82.8 mL/min (by C-G formula based on SCr of 1.18 mg/dL).   Medical History: Past Medical History:  Diagnosis Date  . Kidney stone    Assessment: 89 YOM presenting with calf pain, doppler + for DVT L-popliteal/peroneal, also with SOB. CTA resulted positive for acute PE with RHS (RV/LV 1.19). Not on anticoagulation PTA, CBC wnl.   Initial heparin level therapeutic at 0.46. No bleeding issues reported.  Goal of Therapy:  Heparin level 0.3-0.7 units/ml Monitor platelets by anticoagulation protocol: Yes   Plan:  Continue heparin IV at 1400 units/hr Check confirmatory heparin level with AM labs Monitor daily CBC, s/sx bleeding F/u long term AC plan and ability to switch to [place], PharmD, BCPS Please check AMION for all [place] contact numbers Clinical Pharmacist 11/04/2020 10:25 PM

## 2020-11-05 ENCOUNTER — Inpatient Hospital Stay (HOSPITAL_COMMUNITY): Payer: Medicare Other

## 2020-11-05 DIAGNOSIS — I2609 Other pulmonary embolism with acute cor pulmonale: Secondary | ICD-10-CM

## 2020-11-05 DIAGNOSIS — I10 Essential (primary) hypertension: Secondary | ICD-10-CM

## 2020-11-05 DIAGNOSIS — I82409 Acute embolism and thrombosis of unspecified deep veins of unspecified lower extremity: Secondary | ICD-10-CM | POA: Diagnosis present

## 2020-11-05 DIAGNOSIS — E041 Nontoxic single thyroid nodule: Secondary | ICD-10-CM

## 2020-11-05 LAB — COMPREHENSIVE METABOLIC PANEL
ALT: 14 U/L (ref 0–44)
AST: 17 U/L (ref 15–41)
Albumin: 3.4 g/dL — ABNORMAL LOW (ref 3.5–5.0)
Alkaline Phosphatase: 57 U/L (ref 38–126)
Anion gap: 9 (ref 5–15)
BUN: 13 mg/dL (ref 6–20)
CO2: 22 mmol/L (ref 22–32)
Calcium: 8.8 mg/dL — ABNORMAL LOW (ref 8.9–10.3)
Chloride: 104 mmol/L (ref 98–111)
Creatinine, Ser: 0.94 mg/dL (ref 0.61–1.24)
GFR, Estimated: >60 mL/min (ref >60)
Glucose, Bld: 84 mg/dL (ref 70–99)
Potassium: 3.6 mmol/L (ref 3.5–5.1)
Sodium: 135 mmol/L (ref 135–145)
Total Bilirubin: 1 mg/dL (ref 0.3–1.2)
Total Protein: 7 g/dL (ref 6.5–8.1)

## 2020-11-05 LAB — ECHOCARDIOGRAM COMPLETE
Area-P 1/2: 2.62 cm2
Height: 73 in
S' Lateral: 3.2 cm
Weight: 3040 oz

## 2020-11-05 LAB — CBC
HCT: 38.8 % — ABNORMAL LOW (ref 39.0–52.0)
Hemoglobin: 14.2 g/dL (ref 13.0–17.0)
MCH: 36.1 pg — ABNORMAL HIGH (ref 26.0–34.0)
MCHC: 36.6 g/dL — ABNORMAL HIGH (ref 30.0–36.0)
MCV: 98.7 fL (ref 80.0–100.0)
Platelets: 142 10*3/uL — ABNORMAL LOW (ref 150–400)
RBC: 3.93 MIL/uL — ABNORMAL LOW (ref 4.22–5.81)
RDW: 12.1 % (ref 11.5–15.5)
WBC: 6.9 10*3/uL (ref 4.0–10.5)
nRBC: 0 % (ref 0.0–0.2)

## 2020-11-05 LAB — HEPARIN LEVEL (UNFRACTIONATED): Heparin Unfractionated: 0.38 IU/mL (ref 0.30–0.70)

## 2020-11-05 MED ORDER — APIXABAN (ELIQUIS) EDUCATION KIT FOR DVT/PE PATIENTS
PACK | Freq: Once | Status: AC
  Filled 2020-11-05: qty 1

## 2020-11-05 MED ORDER — APIXABAN 5 MG PO TABS
10.0000 mg | ORAL_TABLET | Freq: Two times a day (BID) | ORAL | Status: DC
  Administered 2020-11-06: 10 mg via ORAL
  Filled 2020-11-05: qty 2

## 2020-11-05 MED ORDER — NITROGLYCERIN IN D5W 200-5 MCG/ML-% IV SOLN
INTRAVENOUS | Status: AC
  Filled 2020-11-05: qty 250

## 2020-11-05 MED ORDER — APIXABAN 5 MG PO TABS: 5.0000 mg | ORAL_TABLET | Freq: Two times a day (BID) | ORAL | Status: DC

## 2020-11-05 NOTE — Progress Notes (Addendum)
[place] for heparin>>apixaban 1/22 Indication: DVT  No Known Allergies  Patient Measurements: Height: 6\' 1"  (185.4 cm) Weight: 86.2 kg (190 lb) IBW/kg (Calculated) : 79.9 Heparin Dosing Weight: 86.2 kg  Vital Signs: Temp: 98 F (36.7 C) (01/21 0334) Temp Source: Oral (01/21 0334) BP: 122/88 (01/21 0334) Pulse Rate: 78 (01/21 0334)  Labs: Recent Labs    11/04/20 0949 11/04/20 1505 11/04/20 2119 11/05/20 0236  HGB 16.0  --   --  14.2  HCT 44.8  --   --  38.8*  PLT 152  --   --  142*  HEPARINUNFRC  --   --  0.46 0.38  CREATININE 1.18  --   --  0.94  TROPONINIHS 88* 70*  --   --     Estimated Creatinine Clearance: 103.9 mL/min (by C-G formula based on SCr of 0.94 mg/dL).   Medical History: Past Medical History:  Diagnosis Date   Kidney stone    Assessment: 19 YOM presenting with calf pain, doppler + for DVT L-popliteal/peroneal, also with SOB. CTA resulted positive for acute PE with RHS (RV/LV 1.19). Not on anticoagulation PTA, CBC wnl.   New orders to transition to apixaban tomorrow morning, hgb down slightly this morning from 16>14.2, pltc low but stable at 142. No bleeding issues noted.   Heparin level at goal this morning, will continue overnight without changes.   Goal of Therapy:  Heparin level 0.3-0.7 units/ml Monitor platelets by anticoagulation protocol: Yes   Plan:  Stop heparin once apixaban given in am Recheck CBC in am to ensure stable Apixaban 10mg  bid x 7 days then 5mg  bid Follow up copay with TOC Will provide education prior to discharge  Erin Hearing PharmD., BCPS Clinical Pharmacist 11/05/2020 8:11 AM

## 2020-11-05 NOTE — Progress Notes (Signed)
  Echocardiogram 2D Echocardiogram has been performed.  Alex Glover 11/05/2020, 8:52 AM

## 2020-11-05 NOTE — Care Management (Addendum)
1100 11-05-20 Benefits check submitted for apixaban 10mg  bid x 7 days then 5mg  bid. Case Manager will continue to follow for cost. Graves-Bigelow, Ocie Cornfield, RN, BSN  Case Manager    11-05-20 1410 Case Manager received a referral regarding primary care provider. Case Manager discussed with patient regarding the community health and wellness clinic. Patient is agreeable to having Case Manager schedule an appointment. Information will be placed on the AVS. Case Manager will continue to follow. Bethena Roys, RN,BSN Case Manager

## 2020-11-05 NOTE — Plan of Care (Signed)

## 2020-11-05 NOTE — Progress Notes (Signed)
PROGRESS NOTE    Alex Glover  [id number] DOB: 22-Sep-1966 DOA: 11/04/2020 PCP: System, Provider Not In    Brief Narrative:  Alex Glover is a 55 year old male with past medical history significant for nephrolithiasis, tobacco use disorder with occasional alcohol use who presented to ED with progressive shortness of breath over the past 2 days. Patient reports left leg injury 2 weeks ago when a tree branch struck his left calf. Patient also reports he received his third [place] booster about 2 weeks ago. No sick contacts, no recent travel or prolonged immobilization.. Further denied headaches, no fever/chills/night sweats, no sore throat, no chest pain, no palpitations, no abdominal pain, no weakness, no fatigue.  Temperature 98.4, HR 83, RR 18, BP 151/103, SPO2 100% on room air. Sodium 136, potassium 3.8, chloride 101, CO2 25, glucose 94, BUN 9, creatinine 1.18. Troponin 88>70. WBC 7.8, hemoglobin 16.0, platelets 152. SARS-CoV-2 negative. Thomson rhythm, rate 81, no concerning ST elevation/depression or T wave inversion. Chest x-ray with mild right perihilar and left base subsegmental atelectasis and/or scarring, no acute infiltrate. CT chest angiogram with large central obstructing pulmonary embolism within the distal right main pulmonary artery extending into the right lower lobar/subsegmental pulmonary arteries and multiple clots extending into segmental branches of the left upper lobe, lingula and left lower lobe with signs of right heart strain. Patient was started on a heparin drip. Hospitalist service consulted for further evaluation and management of acute PE with CT findings of right heart strain.   Assessment & Plan:   Active Problems:   Acute pulmonary embolism (HCC)   Acute pulmonary embolism Age indeterminant left lower extremity DVT Patient presenting to the ED with progressive shortness of breath over the past 2 days. Patient denies prolonged immobilization, but does  note recent trauma to left calf when was struck by a large tree branch 2 weeks ago. CT angiogram chest notable for large central obstructing pulmonary embolism within the distal right main pulmonary artery extending into the right lower lobar/subsegmental pulmonary arteries and multiple clots extending into segmental branches of the left upper lobe, lingula and left lower lobe with signs of right heart strain. Vascular duplex ultrasound left lower extremity with age-indeterminate DVT left peroneal and popliteal vein. TTE with LVEF 60-65%, no LV regional wall motion normalities, mild concentric LVH, LV diastolic primers within normal limits, RV systolic function normal, IVC dilated in size. --Heparin drip transition to Eliquis today --Currently oxygenating well on room air, will assess ambulatory oxygen status --Transition of care for NOAC benefit check --Continue to monitor on telemetry  Elevated troponin likely secondary to type II demand ischemia  Etiology likely secondary to acute PE as above. Troponin 88 followed by 70. EKG with no dynamic changes. Patient asymptomatic without chest pain or palpitations. --Treatment for acute PE as above  Right thyroid nodule Anterior mediastinum nodule tissue Incidental finding of large right thyroid nodule with increased nodule tissue anterior mediastinum. Differential diagnosis includes thymic nodular hyperplasia versus benign/malignant neoplasm. Discussed with patient findings and need for close outpatient follow-up with PCP. Would recommend outpatient thyroid ultrasound and potential cardiothoracic surgery referral for further evaluation and management.  Essential hypertension --Started on amlodipine 10 mg p.o. daily --continue to monitor BP   DVT prophylaxis: Heparin drip transition to Eliquis Code Status: Full code Family Communication: Updated patient extensively at bedside  Disposition Plan:  Status is: Inpatient  Remains inpatient appropriate  because:Ongoing diagnostic testing needed not appropriate for outpatient work up, Unsafe d/c plan, IV treatments appropriate  due to intensity of illness or inability to take PO and Inpatient level of care appropriate due to severity of illness   Dispo:  Patient From: Home  Planned Disposition: Home  Expected discharge date: 11/06/2020  Medically stable for discharge: No   Consultants:   None  Procedures:   TTE  Left lower extremity vascular duplex ultrasound  Antimicrobials:   None   Subjective: Patient seen and examined at bedside, resting comfortably. About to go down for his echocardiogram. Continues on heparin drip this morning with plan to transition to Eliquis. Continues with mild shortness of breath with ambulation. Otherwise no other complaints or concerns at this time. Denies headache, no visual changes, no chest pain, no palpitations, no fever/chills/night sweats, no nausea/vomiting/diarrhea, no abdominal pain, no weakness, no fatigue, no paresthesias. No acute events overnight per nursing staff.  Objective: Vitals:   11/04/20 1906 11/04/20 2115 11/05/20 0000 11/05/20 0334  BP: (!) 131/96  128/84 122/88  Pulse: 88   78  Resp: 19 20 12    Temp:  98.1 F (36.7 C) 98 F (36.7 C) 98 F (36.7 C)  TempSrc:  Oral Oral Oral  SpO2: 99%     Weight:      Height:        Intake/Output Summary (Last 24 hours) at 11/05/2020 1306 Last data filed at 11/05/2020 0535 Gross per 24 hour  Intake 1090 ml  Output 1100 ml  Net -10 ml   Filed Weights   11/04/20 0939  Weight: 86.2 kg    Examination:  General exam: Appears calm and comfortable  Respiratory system: Clear to auscultation. Respiratory effort normal. Oxygenating well on room air Cardiovascular system: S1 & S2 heard, RRR. No JVD, murmurs, rubs, gallops or clicks. No pedal edema. Gastrointestinal system: Abdomen is nondistended, soft and nontender. No organomegaly or masses felt. Normal bowel sounds heard. Central  nervous system: Alert and oriented. No focal neurological deficits. Extremities: Symmetric 5 x 5 power. Skin: No rashes, lesions or ulcers Psychiatry: Judgement and insight appear normal. Mood & affect appropriate.     Data Reviewed: I have personally reviewed following labs and imaging studies  CBC: Recent Labs  Lab 11/04/20 0949 11/05/20 0236  WBC 7.8 6.9  HGB 16.0 14.2  HCT 44.8 38.8*  MCV 101.4* 98.7  PLT 152 [id number]*   Basic Metabolic Panel: Recent Labs  Lab 11/04/20 0949 11/05/20 0236  NA 136 135  K 3.8 3.6  CL 101 104  CO2 25 22  GLUCOSE 94 84  BUN 9 13  CREATININE 1.18 0.94  CALCIUM 9.4 8.8*   GFR: Estimated Creatinine Clearance: 103.9 mL/min (by C-G formula based on SCr of 0.94 mg/dL). Liver Function Tests: Recent Labs  Lab 11/05/20 0236  AST 17  ALT 14  ALKPHOS 57  BILITOT 1.0  PROT 7.0  ALBUMIN 3.4*   No results for input(s): LIPASE, AMYLASE in the last 168 hours. No results for input(s): AMMONIA in the last 168 hours. Coagulation Profile: No results for input(s): INR, PROTIME in the last 168 hours. Cardiac Enzymes: No results for input(s): CKTOTAL, CKMB, CKMBINDEX, TROPONINI in the last 168 hours. BNP (last 3 results) No results for input(s): PROBNP in the last 8760 hours. HbA1C: No results for input(s): HGBA1C in the last 72 hours. CBG: No results for input(s): GLUCAP in the last 168 hours. Lipid Profile: No results for input(s): CHOL, HDL, LDLCALC, TRIG, CHOLHDL, LDLDIRECT in the last 72 hours. Thyroid Function Tests: No results for input(s): TSH, T4TOTAL, FREET4,  T3FREE, THYROIDAB in the last 72 hours. Anemia Panel: No results for input(s): VITAMINB12, FOLATE, FERRITIN, TIBC, IRON, RETICCTPCT in the last 72 hours. Sepsis Labs: No results for input(s): PROCALCITON, LATICACIDVEN in the last 168 hours.  Recent Results (from the past 240 hour(s))  SARS CORONAVIRUS 2 (TAT 6-24 HRS) Nasopharyngeal Nasopharyngeal Swab     Status: None    Collection Time: 11/04/20  4:25 PM   Specimen: Nasopharyngeal Swab  Result Value Ref Range Status   SARS Coronavirus 2 NEGATIVE NEGATIVE Final    Comment: (NOTE) SARS-CoV-2 target nucleic acids are NOT DETECTED.  The SARS-CoV-2 RNA is generally detectable in upper and lower respiratory specimens during the acute phase of infection. Negative results do not preclude SARS-CoV-2 infection, do not rule out co-infections with other pathogens, and should not be used as the sole basis for treatment or other patient management decisions. Negative results must be combined with clinical observations, patient history, and epidemiological information. The expected result is Negative.  Fact Sheet for Patients: SugarRoll.be  Fact Sheet for Healthcare Providers: https://www.woods-mathews.com/  This test is not yet approved or cleared by the [place] FDA and  has been authorized for detection and/or diagnosis of SARS-CoV-2 by FDA under an Emergency Use Authorization (EUA). This EUA will remain  in effect (meaning this test can be used) for the duration of the COVID-19 declaration under Se ction 564(b)(1) of the Act, 21 U.S.C. section 360bbb-3(b)(1), unless the authorization is terminated or revoked sooner.  Performed at [facility], [place] [address], [place], [place] [address]          Radiology Studies: DG Chest 2 View  Result Date: 11/04/2020 CLINICAL DATA:  Near syncope.  Chest pain. EXAM: CHEST - 2 VIEW COMPARISON:  No prior. FINDINGS: Mediastinum hilar structures normal. Mild right perihilar and left base subsegmental atelectasis and or scarring. Lungs are clear of acute infiltrates. No pleural effusion or pneumothorax. Heart size normal. No acute bony abnormality. IMPRESSION: Mild right perihilar and left base subsegmental atelectasis and or scarring. No acute infiltrate. Electronically Signed   By: Marcello Moores  Register   On: 11/04/2020  10:14   CT Angio Chest PE W and/or Wo Contrast  Result Date: 11/04/2020 CLINICAL DATA:  Shortness of breath. High probability for acute pulmonary embolus. EXAM: CT ANGIOGRAPHY CHEST WITH CONTRAST TECHNIQUE: Multidetector CT imaging of the chest was performed using the standard protocol during bolus administration of intravenous contrast. Multiplanar CT image reconstructions and MIPs were obtained to evaluate the vascular anatomy. CONTRAST:  84mL OMNIPAQUE IOHEXOL 350 MG/ML SOLN COMPARISON:  None. FINDINGS: Cardiovascular: The heart size appears normal. No pericardial effusion. Large central obstructing pulmonary embolus is identified within the distal right main pulmonary artery extending into the right lower lobar and segmental pulmonary arteries. There also multiple clots extending into the segmental branches of the left upper lobe, lingula and left lower lobe. Signs of right heart strain is identified with RV to LV ratio of 1.19. Mediastinum/Nodes: Large low-attenuation nodule within the right lobe of thyroid gland has a maximum dimension of 3.7 cm. Increased nodular soft tissue within the anterior mediastinum is identified within the thyroid bed, image 69/5. No axillary or supraclavicular adenopathy. No mediastinal or hilar adenopathy. Lungs/Pleura: No pleural effusion. Subsegmental atelectasis is identified within both lung bases. No pleural effusion, airspace consolidation or pneumothorax. No interstitial edema. Upper Abdomen: No acute abnormality. Musculoskeletal: No chest wall abnormality. No acute or significant osseous findings. Review of the MIP images confirms the above findings.  IMPRESSION: 1. Examination is positive for acute pulmonary embolus with CT evidence of right heart strain (RV/LV Ratio 1.19) consistent with at least submassive (intermediate risk) PE. The presence of right heart strain has been associated with an increased risk of morbidity and mortality. 2. Large low-attenuation nodule  within the right lobe of thyroid gland. Recommend thyroid US (ref: J Am Coll Radiol. 2015 Feb;12(2): 143-50). 3. Increased nodular soft tissue within the anterior mediastinum. Primary differential considerations include thymic nodular or lymphoid hyperplasia versus benign or malignant neoplasm. Recommend referral to thoracic surgery for further management. 4. Critical Value/emergent results were called by telephone at the time of interpretation on 11/04/2020 at 4:33 pm to provider Blanchie Dessert, who verbally acknowledged these results. Electronically Signed   By: Kerby Moors M.D.   On: 11/04/2020 16:34   ECHOCARDIOGRAM COMPLETE  Result Date: 11/05/2020    ECHOCARDIOGRAM REPORT   Patient Name:   COBEN SELVAGE Date of Exam: 11/05/2020 Medical Rec #:  [id number]     Height:       73.0 in Accession #:    [id number]    Weight:       190.0 lb Date of Birth:  Jun 09, 1966    BSA:          2.105 m Patient Age:    49 years      BP:           122/88 mmHg Patient Gender: M             HR:           63 bpm. Exam Location:  Inpatient Procedure: 2D Echo Indications:    pulmonary embolism  History:        Patient has no prior history of Echocardiogram examinations.  Sonographer:    Johny Chess Referring Phys: Arnell Asal PRASHANT K [place]  1. Left ventricular ejection fraction, by estimation, is 60 to 65%. The left ventricle has normal function. The left ventricle has no regional wall motion abnormalities. There is mild concentric left ventricular hypertrophy. Left ventricular diastolic parameters were normal.  2. Right ventricular systolic function is normal. The right ventricular size is normal. There is moderately elevated pulmonary artery systolic pressure.  3. The mitral valve is normal in structure. Trivial mitral valve regurgitation. No evidence of mitral stenosis.  4. The aortic valve is tricuspid. Aortic valve regurgitation is not visualized. No aortic stenosis is present.  5. The inferior vena cava is  dilated in size with >50% respiratory variability, suggesting right atrial pressure of 8 mmHg. FINDINGS  Left Ventricle: Left ventricular ejection fraction, by estimation, is 60 to 65%. The left ventricle has normal function. The left ventricle has no regional wall motion abnormalities. The left ventricular internal cavity size was normal in size. There is  mild concentric left ventricular hypertrophy. Left ventricular diastolic parameters were normal. Normal left ventricular filling pressure. Right Ventricle: The right ventricular size is normal. No increase in right ventricular wall thickness. Right ventricular systolic function is normal. There is moderately elevated pulmonary artery systolic pressure. The tricuspid regurgitant velocity is 3.26 m/s, and with an assumed right atrial pressure of 8 mmHg, the estimated right ventricular systolic pressure is 123XX123 mmHg. Left Atrium: Left atrial size was normal in size. Right Atrium: Right atrial size was normal in size. Pericardium: There is no evidence of pericardial effusion. Mitral Valve: The mitral valve is normal in structure. Trivial mitral valve regurgitation. No evidence of mitral valve stenosis. Tricuspid Valve: The tricuspid valve  is normal in structure. Tricuspid valve regurgitation is mild . No evidence of tricuspid stenosis. Aortic Valve: The aortic valve is tricuspid. Aortic valve regurgitation is not visualized. No aortic stenosis is present. Pulmonic Valve: The pulmonic valve was normal in structure. Pulmonic valve regurgitation is not visualized. No evidence of pulmonic stenosis. Aorta: The aortic root is normal in size and structure. Venous: The inferior vena cava is dilated in size with greater than 50% respiratory variability, suggesting right atrial pressure of 8 mmHg. IAS/Shunts: No atrial level shunt detected by color flow Doppler.  LEFT VENTRICLE PLAX 2D LVIDd:         4.90 cm Diastology LVIDs:         3.20 cm LV e' medial:    9.68 cm/s LV PW:          1.30 cm LV E/e' medial:  6.5 LV IVS:        1.20 cm LV e' lateral:   11.60 cm/s                        LV E/e' lateral: 5.4  RIGHT VENTRICLE            IVC RV S prime:     9.36 cm/s  IVC diam: 2.10 cm TAPSE (M-mode): 1.6 cm LEFT ATRIUM             Index       RIGHT ATRIUM           Index LA diam:        3.80 cm 1.81 cm/m  RA Area:     15.70 cm LA Vol (A2C):   49.9 ml 23.70 ml/m RA Volume:   40.20 ml  19.10 ml/m LA Vol (A4C):   40.5 ml 19.24 ml/m LA Biplane Vol: 47.1 ml 22.37 ml/m  AORTIC VALVE LVOT Vmax:   83.60 cm/s LVOT Vmean:  49.700 cm/s LVOT VTI:    0.154 m  AORTA Ao Asc diam: 2.90 cm MITRAL VALVE               TRICUSPID VALVE MV Area (PHT): 2.62 cm    TR Peak grad:   42.5 mmHg MV Decel Time: 289 msec    TR Vmax:        326.00 cm/s MV E velocity: 62.60 cm/s MV A velocity: 57.40 cm/s  SHUNTS MV E/A ratio:  1.09        Systemic VTI: 0.15 m Skeet Latch MD Electronically signed by Skeet Latch MD Signature Date/Time: 11/05/2020/11:45:58 AM    Final    VAS [place] LOWER EXTREMITY VENOUS (DVT) (ONLY [facility] & [facility])  Result Date: 11/04/2020  Lower Venous DVT Study Indications: Swelling, and Edema.  Comparison Study: no prior Performing Technologist: Abram Sander RVS  Examination Guidelines: A complete evaluation includes B-mode imaging, spectral Doppler, color Doppler, and power Doppler as needed of all accessible portions of each vessel. Bilateral testing is considered an integral part of a complete examination. Limited examinations for reoccurring indications may be performed as noted. The reflux portion of the exam is performed with the patient in reverse Trendelenburg.  +-----+---------------+---------+-----------+----------+--------------+ RIGHTCompressibilityPhasicitySpontaneityPropertiesThrombus Aging +-----+---------------+---------+-----------+----------+--------------+ CFV  Full           Yes      Yes                                  +-----+---------------+---------+-----------+----------+--------------+   +---------+---------------+---------+-----------+----------+-----------------+  LEFT     CompressibilityPhasicitySpontaneityPropertiesThrombus Aging    +---------+---------------+---------+-----------+----------+-----------------+ CFV      Full           Yes      Yes                                    +---------+---------------+---------+-----------+----------+-----------------+ SFJ      Full                                                           +---------+---------------+---------+-----------+----------+-----------------+ FV Prox  Full                                                           +---------+---------------+---------+-----------+----------+-----------------+ FV Mid   Full                                                           +---------+---------------+---------+-----------+----------+-----------------+ FV DistalFull                                                           +---------+---------------+---------+-----------+----------+-----------------+ PFV      Full                                                           +---------+---------------+---------+-----------+----------+-----------------+ POP      None           No       No                   Age Indeterminate +---------+---------------+---------+-----------+----------+-----------------+ PTV      Full                                                           +---------+---------------+---------+-----------+----------+-----------------+ PERO     None                                         Age Indeterminate +---------+---------------+---------+-----------+----------+-----------------+     Summary: RIGHT: - No evidence of common femoral vein obstruction.  LEFT: - Findings consistent with age indeterminate deep vein thrombosis involving the left popliteal vein, and left peroneal veins. - There is no  evidence of deep vein thrombosis in the lower extremity.  *  See table(s) above for measurements and observations. Electronically signed by Servando Snare MD on 11/04/2020 at 2:27:11 PM.    Final         Scheduled Meds: . amLODipine  10 mg Oral Daily  . [START ON 11/06/2020] apixaban   Does not apply Once  . [START ON 11/06/2020] apixaban  10 mg Oral BID   Followed by  . [START ON 11/13/2020] apixaban  5 mg Oral BID   Continuous Infusions: . heparin 1,400 Units/hr (11/05/20 0437)  . methocarbamol (ROBAXIN) IV       LOS: 1 day    Time spent: 36 minutes spent on chart review, discussion with nursing staff, consultants, updating family and interview/physical exam; more than 50% of that time was spent in counseling and/or coordination of care.    Jone Panebianco J [place] DO [facility] Available via Epic secure chat 7am-7pm After these hours, please refer to coverage provider listed on [email or web address] 11/05/2020, 1:06 PM

## 2020-11-06 DIAGNOSIS — I824Y2 Acute embolism and thrombosis of unspecified deep veins of left proximal lower extremity: Secondary | ICD-10-CM

## 2020-11-06 LAB — CBC
HCT: 40.7 % (ref 39.0–52.0)
Hemoglobin: 13.9 g/dL (ref 13.0–17.0)
MCH: 34.9 pg — ABNORMAL HIGH (ref 26.0–34.0)
MCHC: 34.2 g/dL (ref 30.0–36.0)
MCV: 102.3 fL — ABNORMAL HIGH (ref 80.0–100.0)
Platelets: 139 10*3/uL — ABNORMAL LOW (ref 150–400)
RBC: 3.98 MIL/uL — ABNORMAL LOW (ref 4.22–5.81)
RDW: 12 % (ref 11.5–15.5)
WBC: 6.6 10*3/uL (ref 4.0–10.5)
nRBC: 0 % (ref 0.0–0.2)

## 2020-11-06 LAB — HEPARIN LEVEL (UNFRACTIONATED): Heparin Unfractionated: 0.44 IU/mL (ref 0.30–0.70)

## 2020-11-06 MED ORDER — AMLODIPINE BESYLATE 10 MG PO TABS: 10.0000 mg | ORAL_TABLET | Freq: Every day | ORAL | 0 refills | Status: DC

## 2020-11-06 MED ORDER — APIXABAN 5 MG PO TABS: ORAL_TABLET | ORAL | 0 refills | Status: DC

## 2020-11-06 NOTE — Discharge Summary (Signed)
Physician Discharge Summary  Alex Glover [id number] DOB: 1966-06-28 DOA: 11/04/2020  PCP: System, Provider Not In  Admit date: 11/04/2020 Discharge date: 11/06/2020  Admitted From: Home Disposition: Home  Recommendations for Outpatient Follow-up:  1. Follow up with provider [facility] as scheduled 2. Started on Eliquis for acute PE and age-indeterminate DVT 3. Will need further follow-up and work-up of thyroid nodule and anterior mediastinal nodular tissue.   4. Started on amlodipine 10 mg p.o. daily for blood pressure control 5. Please obtain BMP/CBC in one week  Home Health: No Equipment/Devices: None  Discharge Condition: Stable CODE STATUS: Full code Diet recommendation: Heart healthy diet  History of present illness:  Alex Glover is a 55 year old male with past medical history significant for nephrolithiasis, tobacco use disorder with occasional alcohol use who presented to ED with progressive shortness of breath over the past 2 days. Patient reports left leg injury 2 weeks ago when a tree branch struck his left calf. Patient also reports he received his third Joy booster about 2 weeks ago. No sick contacts, no recent travel or prolonged immobilization.. Further denied headaches, no fever/chills/night sweats, no sore throat, no chest pain, no palpitations, no abdominal pain, no weakness, no fatigue.  Temperature 98.4, HR 83, RR 18, BP 151/103, SPO2 100% on room air. Sodium 136, potassium 3.8, chloride 101, CO2 25, glucose 94, BUN 9, creatinine 1.18. Troponin 88>70. WBC 7.8, hemoglobin 16.0, platelets 152. SARS-CoV-2 negative. Thomson rhythm, rate 81, no concerning ST elevation/depression or T wave inversion. Chest x-ray with mild right perihilar and left base subsegmental atelectasis and/or scarring, no acute infiltrate. CT chest angiogram with large central obstructing pulmonary embolism within the distal right main pulmonary artery  extending into the right lower lobar/subsegmental pulmonary arteries and multiple clots extending into segmental branches of the left upper lobe, lingula and left lower lobe with signs of right heart strain. Patient was started on a heparin drip. Hospitalist service consulted for further evaluation and management of acute PE with CT findings of right heart strain.  Hospital course:  Acute pulmonary embolism Age indeterminant left lower extremity DVT Patient presenting to the ED with progressive shortness of breath over the past 2 days. Patient denies prolonged immobilization, but does note recent trauma to left calf when was struck by a large tree branch 2 weeks ago. CT angiogram chest notable for large central obstructing pulmonary embolism within the distal right main pulmonary artery extending into the right lower lobar/subsegmental pulmonary arteries and multiple clots extending into segmental branches of the left upper lobe, lingula and left lower lobe with signs of right heart strain. Vascular duplex ultrasound left lower extremity with age-indeterminate DVT left peroneal and popliteal vein. TTE with LVEF 60-65%, no LV regional wall motion normalities, mild concentric LVH, LV diastolic primers within normal limits, RV systolic function normal, IVC dilated in size.  Patient was initially started on a heparin drip which was transitioned to Eliquis.  No hypoxia noted even with exertion.  Follow[facility] with [facility] as scheduled.  Elevated troponin likely secondary to type II demand ischemia  Etiology likely secondary to acute PE as above. Troponin 88 followed by 70. EKG with no dynamic changes. Patient asymptomatic without chest pain or palpitations.  TTE without LV wall motion abnormalities.  Right thyroid nodule Anterior mediastinum nodule tissue Incidental finding of large right thyroid nodule with increased nodule tissue anterior mediastinum. Differential diagnosis  includes thymic nodular hyperplasia versus benign/malignant neoplasm. Discussed with  patient findings and need for close outpatient follow-up with PCP. Would recommend outpatient thyroid ultrasound and potential cardiothoracic surgery referral for further evaluation and management.  Essential hypertension Started on amlodipine 10 mg p.o. daily   Discharge Diagnoses:  Principal Problem:   Acute pulmonary embolism (HCC) Active Problems:   Thyroid nodule   Essential hypertension   DVT (deep venous thrombosis) [facility])    Discharge Instructions  Discharge Instructions    Call MD for:  difficulty breathing, headache or visual disturbances   Complete by: As directed    Call MD for:  extreme fatigue   Complete by: As directed    Call MD for:  persistant dizziness or light-headedness   Complete by: As directed    Call MD for:  persistant nausea and vomiting   Complete by: As directed    Call MD for:  severe uncontrolled pain   Complete by: As directed    Call MD for:  temperature >100.4   Complete by: As directed    Diet - low sodium heart healthy   Complete by: As directed    Increase activity slowly   Complete by: As directed      Allergies as of 11/06/2020   No Known Allergies     Medication List    STOP taking these medications   HYDROcodone-acetaminophen 5-325 MG tablet Commonly known as: NORCO/VICODIN   tamsulosin 0.4 MG Caps capsule Commonly known as: Flomax     TAKE these medications   amLODipine 10 MG tablet Commonly known as: NORVASC Take 1 tablet (10 mg total) by mouth daily.   apixaban 5 MG Tabs tablet Commonly known as: Eliquis Take 2 tablets (10mg ) twice daily for 7 days, then 1 tablet (5mg ) twice daily   ibuprofen 200 MG tablet Commonly known as: ADVIL Take 400 mg by mouth every 6 (six) hours as needed for mild pain (or headaches).   One-A-Day Mens Health Formula Tabs Take 1 tablet by mouth daily with breakfast.       Follow-up Information     [facility] Follow up on 12/01/2020.   Why: for hospital follow up appointment @ 9:50 am and arrive 15 minutes early . Appointment is with Dr. Freeman Caldron.  Contact information: [place] [phone]             No Known Allergies  Consultations:  None   Procedures/Studies: DG Chest 2 View  Result Date: 11/04/2020 CLINICAL DATA:  Near syncope.  Chest pain. EXAM: CHEST - 2 VIEW COMPARISON:  No prior. FINDINGS: Mediastinum hilar structures normal. Mild right perihilar and left base subsegmental atelectasis and or scarring. Lungs are clear of acute infiltrates. No pleural effusion or pneumothorax. Heart size normal. No acute bony abnormality. IMPRESSION: Mild right perihilar and left base subsegmental atelectasis and or scarring. No acute infiltrate. Electronically Signed   By: Marcello Moores  Register   On: 11/04/2020 10:14   CT Angio Chest PE W and/or Wo Contrast  Result Date: 11/04/2020 CLINICAL DATA:  Shortness of breath. High probability for acute pulmonary embolus. EXAM: CT ANGIOGRAPHY CHEST WITH CONTRAST TECHNIQUE: Multidetector CT imaging of the chest was performed using the standard protocol during bolus administration of intravenous contrast. Multiplanar CT image reconstructions and MIPs were obtained to evaluate the vascular anatomy. CONTRAST:  56mL OMNIPAQUE IOHEXOL 350 MG/ML SOLN COMPARISON:  None. FINDINGS: Cardiovascular: The heart size appears normal. No pericardial effusion. Large central obstructing pulmonary embolus is identified within the  distal right main pulmonary artery extending into the right lower lobar and segmental pulmonary arteries. There also multiple clots extending into the segmental branches of the left upper lobe, lingula and left lower lobe. Signs of right heart strain is identified with RV to LV ratio of 1.19. Mediastinum/Nodes: Large low-attenuation nodule within the right lobe of  thyroid gland has a maximum dimension of 3.7 cm. Increased nodular soft tissue within the anterior mediastinum is identified within the thyroid bed, image 69/5. No axillary or supraclavicular adenopathy. No mediastinal or hilar adenopathy. Lungs/Pleura: No pleural effusion. Subsegmental atelectasis is identified within both lung bases. No pleural effusion, airspace consolidation or pneumothorax. No interstitial edema. Upper Abdomen: No acute abnormality. Musculoskeletal: No chest wall abnormality. No acute or significant osseous findings. Review of the MIP images confirms the above findings. IMPRESSION: 1. Examination is positive for acute pulmonary embolus with CT evidence of right heart strain (RV/LV Ratio 1.19) consistent with at least submassive (intermediate risk) PE. The presence of right heart strain has been associated with an increased risk of morbidity and mortality. 2. Large low-attenuation nodule within the right lobe of thyroid gland. Recommend thyroid US (ref: J Am Coll Radiol. 2015 Feb;12(2): 143-50). 3. Increased nodular soft tissue within the anterior mediastinum. Primary differential considerations include thymic nodular or lymphoid hyperplasia versus benign or malignant neoplasm. Recommend referral to thoracic surgery for further management. 4. Critical Value/emergent results were called by telephone at the time of interpretation on 11/04/2020 at 4:33 pm to provider Blanchie Dessert, who verbally acknowledged these results. Electronically Signed   By: Kerby Moors M.D.   On: 11/04/2020 16:34   ECHOCARDIOGRAM COMPLETE  Result Date: 11/05/2020    ECHOCARDIOGRAM REPORT   Patient Name:   CHRISTOPHEN LEVANDOWSKI Date of Exam: 11/05/2020 Medical Rec #:  [id number]     Height:       73.0 in Accession #:    [id number]    Weight:       190.0 lb Date of Birth:  10/21/1965    BSA:          2.105 m Patient Age:    55 years      BP:           122/88 mmHg Patient Gender: M             HR:           63 bpm. Exam  Location:  Inpatient Procedure: 2D Echo Indications:    pulmonary embolism  History:        Patient has no prior history of Echocardiogram examinations.  Sonographer:    Johny Chess Referring Phys: Arnell Asal PRASHANT K Lamar  1. Left ventricular ejection fraction, by estimation, is 60 to 65%. The left ventricle has normal function. The left ventricle has no regional wall motion abnormalities. There is mild concentric left ventricular hypertrophy. Left ventricular diastolic parameters were normal.  2. Right ventricular systolic function is normal. The right ventricular size is normal. There is moderately elevated pulmonary artery systolic pressure.  3. The mitral valve is normal in structure. Trivial mitral valve regurgitation. No evidence of mitral stenosis.  4. The aortic valve is tricuspid. Aortic valve regurgitation is not visualized. No aortic stenosis is present.  5. The inferior vena cava is dilated in size with >50% respiratory variability, suggesting right atrial pressure of 8 mmHg. FINDINGS  Left Ventricle: Left ventricular ejection fraction, by estimation, is 60 to 65%. The left ventricle has normal function. The left ventricle has  no regional wall motion abnormalities. The left ventricular internal cavity size was normal in size. There is  mild concentric left ventricular hypertrophy. Left ventricular diastolic parameters were normal. Normal left ventricular filling pressure. Right Ventricle: The right ventricular size is normal. No increase in right ventricular wall thickness. Right ventricular systolic function is normal. There is moderately elevated pulmonary artery systolic pressure. The tricuspid regurgitant velocity is 3.26 m/s, and with an assumed right atrial pressure of 8 mmHg, the estimated right ventricular systolic pressure is 97.0 mmHg. Left Atrium: Left atrial size was normal in size. Right Atrium: Right atrial size was normal in size. Pericardium: There is no evidence of  pericardial effusion. Mitral Valve: The mitral valve is normal in structure. Trivial mitral valve regurgitation. No evidence of mitral valve stenosis. Tricuspid Valve: The tricuspid valve is normal in structure. Tricuspid valve regurgitation is mild . No evidence of tricuspid stenosis. Aortic Valve: The aortic valve is tricuspid. Aortic valve regurgitation is not visualized. No aortic stenosis is present. Pulmonic Valve: The pulmonic valve was normal in structure. Pulmonic valve regurgitation is not visualized. No evidence of pulmonic stenosis. Aorta: The aortic root is normal in size and structure. Venous: The inferior vena cava is dilated in size with greater than 50% respiratory variability, suggesting right atrial pressure of 8 mmHg. IAS/Shunts: No atrial level shunt detected by color flow Doppler.  LEFT VENTRICLE PLAX 2D LVIDd:         4.90 cm Diastology LVIDs:         3.20 cm LV e' medial:    9.68 cm/s LV PW:         1.30 cm LV E/e' medial:  6.5 LV IVS:        1.20 cm LV e' lateral:   11.60 cm/s                        LV E/e' lateral: 5.4  RIGHT VENTRICLE            IVC RV S prime:     9.36 cm/s  IVC diam: 2.10 cm TAPSE (M-mode): 1.6 cm LEFT ATRIUM             Index       RIGHT ATRIUM           Index LA diam:        3.80 cm 1.81 cm/m  RA Area:     15.70 cm LA Vol (A2C):   49.9 ml 23.70 ml/m RA Volume:   40.20 ml  19.10 ml/m LA Vol (A4C):   40.5 ml 19.24 ml/m LA Biplane Vol: 47.1 ml 22.37 ml/m  AORTIC VALVE LVOT Vmax:   83.60 cm/s LVOT Vmean:  49.700 cm/s LVOT VTI:    0.154 m  AORTA Ao Asc diam: 2.90 cm MITRAL VALVE               TRICUSPID VALVE MV Area (PHT): 2.62 cm    TR Peak grad:   42.5 mmHg MV Decel Time: 289 msec    TR Vmax:        326.00 cm/s MV E velocity: 62.60 cm/s MV A velocity: 57.40 cm/s  SHUNTS MV E/A ratio:  1.09        Systemic VTI: 0.15 m Skeet Latch MD Electronically signed by Skeet Latch MD Signature Date/Time: 11/05/2020/11:45:58 AM    Final    VAS [place] LOWER EXTREMITY  VENOUS (DVT) (ONLY [facility] & [facility])  Result Date: 11/04/2020  Lower Venous DVT Study Indications: Swelling, and Edema.  Comparison Study: no prior Performing Technologist: Abram Sander RVS  Examination Guidelines: A complete evaluation includes B-mode imaging, spectral Doppler, color Doppler, and power Doppler as needed of all accessible portions of each vessel. Bilateral testing is considered an integral part of a complete examination. Limited examinations for reoccurring indications may be performed as noted. The reflux portion of the exam is performed with the patient in reverse Trendelenburg.  +-----+---------------+---------+-----------+----------+--------------+ RIGHTCompressibilityPhasicitySpontaneityPropertiesThrombus Aging +-----+---------------+---------+-----------+----------+--------------+ CFV  Full           Yes      Yes                                 +-----+---------------+---------+-----------+----------+--------------+   +---------+---------------+---------+-----------+----------+-----------------+ LEFT     CompressibilityPhasicitySpontaneityPropertiesThrombus Aging    +---------+---------------+---------+-----------+----------+-----------------+ CFV      Full           Yes      Yes                                    +---------+---------------+---------+-----------+----------+-----------------+ SFJ      Full                                                           +---------+---------------+---------+-----------+----------+-----------------+ FV Prox  Full                                                           +---------+---------------+---------+-----------+----------+-----------------+ FV Mid   Full                                                           +---------+---------------+---------+-----------+----------+-----------------+ FV DistalFull                                                            +---------+---------------+---------+-----------+----------+-----------------+ PFV      Full                                                           +---------+---------------+---------+-----------+----------+-----------------+ POP      None           No       No                   Age Indeterminate +---------+---------------+---------+-----------+----------+-----------------+ PTV      Full                                                           +---------+---------------+---------+-----------+----------+-----------------+  PERO     None                                         Age Indeterminate +---------+---------------+---------+-----------+----------+-----------------+     Summary: RIGHT: - No evidence of common femoral vein obstruction.  LEFT: - Findings consistent with age indeterminate deep vein thrombosis involving the left popliteal vein, and left peroneal veins. - There is no evidence of deep vein thrombosis in the lower extremity.  *See table(s) above for measurements and observations. Electronically signed by Servando Snare MD on 11/04/2020 at 2:27:11 PM.    Final       Subjective: Patient seen and examined at bedside, resting comfortably.  Transition from heparin drip to Eliquis this morning.  Continues without shortness of breath or chest pain.  No acute concerns or questions this morning.  Ready for discharge home.  Denies headache, no visual changes, no chest pain, no palpitations, no shortness of breath, no abdominal pain, no weakness, no fatigue, no paresthesias.  No acute events overnight per nursing staff.  Discharge Exam: Vitals:   11/06/20 0429 11/06/20 0843  BP: 122/80 (!) 126/91  Pulse: 66   Resp:    Temp: 97.9 F (36.6 C)   SpO2: 96%    Vitals:   11/05/20 0334 11/05/20 1600 11/06/20 0429 11/06/20 0843  BP: 122/88 108/81 122/80 (!) 126/91  Pulse: 78 70 66   Resp:  16    Temp: 98 F (36.7 C) 98.4 F (36.9 C) 97.9 F (36.6 C)   TempSrc: Oral  Oral Oral   SpO2:  98% 96%   Weight:      Height:        General: Pt is alert, awake, not in acute distress Cardiovascular: RRR, S1/S2 +, no rubs, no gallops Respiratory: CTA bilaterally, no wheezing, no rhonchi Abdominal: Soft, NT, ND, bowel sounds + Extremities: no edema, no cyanosis    The results of significant diagnostics from this hospitalization (including imaging, microbiology, ancillary and laboratory) are listed below for reference.     Microbiology: Recent Results (from the past 240 hour(s))  SARS CORONAVIRUS 2 (TAT 6-24 HRS) Nasopharyngeal Nasopharyngeal Swab     Status: None   Collection Time: 11/04/20  4:25 PM   Specimen: Nasopharyngeal Swab  Result Value Ref Range Status   SARS Coronavirus 2 NEGATIVE NEGATIVE Final    Comment: (NOTE) SARS-CoV-2 target nucleic acids are NOT DETECTED.  The SARS-CoV-2 RNA is generally detectable in upper and lower respiratory specimens during the acute phase of infection. Negative results do not preclude SARS-CoV-2 infection, do not rule out co-infections with other pathogens, and should not be used as the sole basis for treatment or other patient management decisions. Negative results must be combined with clinical observations, patient history, and epidemiological information. The expected result is Negative.  Fact Sheet for Patients: SugarRoll.be  Fact Sheet for Healthcare Providers: https://www.woods-mathews.com/  This test is not yet approved or cleared by the [place] FDA and  has been authorized for detection and/or diagnosis of SARS-CoV-2 by FDA under an Emergency Use Authorization (EUA). This EUA will remain  in effect (meaning this test can be used) for the duration of the COVID-19 declaration under Se ction 564(b)(1) of the Act, 21 U.S.C. section 360bbb-3(b)(1), unless the authorization is terminated or revoked sooner.  Performed at [facility], [place]  [address] [place], [place] [address]  Labs: BNP (last 3 results) No results for input(s): BNP in the last 8760 hours. Basic Metabolic Panel: Recent Labs  Lab 11/04/20 0949 11/05/20 0236  NA 136 135  K 3.8 3.6  CL 101 104  CO2 25 22  GLUCOSE 94 84  BUN 9 13  CREATININE 1.18 0.94  CALCIUM 9.4 8.8*   Liver Function Tests: Recent Labs  Lab 11/05/20 0236  AST 17  ALT 14  ALKPHOS 57  BILITOT 1.0  PROT 7.0  ALBUMIN 3.4*   No results for input(s): LIPASE, AMYLASE in the last 168 hours. No results for input(s): AMMONIA in the last 168 hours. CBC: Recent Labs  Lab 11/04/20 0949 11/05/20 0236 11/06/20 0255  WBC 7.8 6.9 6.6  HGB 16.0 14.2 13.9  HCT 44.8 38.8* 40.7  MCV 101.4* 98.7 102.3*  PLT 152 142* 139*   Cardiac Enzymes: No results for input(s): CKTOTAL, CKMB, CKMBINDEX, TROPONINI in the last 168 hours. BNP: Invalid input(s): POCBNP CBG: No results for input(s): GLUCAP in the last 168 hours. D-Dimer No results for input(s): DDIMER in the last 72 hours. Hgb A1c No results for input(s): HGBA1C in the last 72 hours. Lipid Profile No results for input(s): CHOL, HDL, LDLCALC, TRIG, CHOLHDL, LDLDIRECT in the last 72 hours. Thyroid function studies No results for input(s): TSH, T4TOTAL, T3FREE, THYROIDAB in the last 72 hours.  Invalid input(s): FREET3 Anemia work up No results for input(s): VITAMINB12, FOLATE, FERRITIN, TIBC, IRON, RETICCTPCT in the last 72 hours. Urinalysis    Component Value Date/Time   LABSPEC 1.015 12/03/2017 1127   PHURINE 6.0 12/03/2017 1127   GLUCOSEU NEGATIVE 12/03/2017 1127   HGBUR TRACE (A) 12/03/2017 1127   BILIRUBINUR NEGATIVE 12/03/2017 1127   KETONESUR NEGATIVE 12/03/2017 1127   PROTEINUR 100 (A) 12/03/2017 1127   UROBILINOGEN 0.2 12/03/2017 1127   NITRITE NEGATIVE 12/03/2017 1127   LEUKOCYTESUR NEGATIVE 12/03/2017 1127   Sepsis Labs Invalid input(s): PROCALCITONIN,  WBC,  LACTICIDVEN Microbiology Recent Results (from the  past 240 hour(s))  SARS CORONAVIRUS 2 (TAT 6-24 HRS) Nasopharyngeal Nasopharyngeal Swab     Status: None   Collection Time: 11/04/20  4:25 PM   Specimen: Nasopharyngeal Swab  Result Value Ref Range Status   SARS Coronavirus 2 NEGATIVE NEGATIVE Final    Comment: (NOTE) SARS-CoV-2 target nucleic acids are NOT DETECTED.  The SARS-CoV-2 RNA is generally detectable in upper and lower respiratory specimens during the acute phase of infection. Negative results do not preclude SARS-CoV-2 infection, do not rule out co-infections with other pathogens, and should not be used as the sole basis for treatment or other patient management decisions. Negative results must be combined with clinical observations, patient history, and epidemiological information. The expected result is Negative.  Fact Sheet for Patients: SugarRoll.be  Fact Sheet for Healthcare Providers: https://www.woods-mathews.com/  This test is not yet approved or cleared by the [place] FDA and  has been authorized for detection and/or diagnosis of SARS-CoV-2 by FDA under an Emergency Use Authorization (EUA). This EUA will remain  in effect (meaning this test can be used) for the duration of the COVID-19 declaration under Se ction 564(b)(1) of the Act, 21 U.S.C. section 360bbb-3(b)(1), unless the authorization is terminated or revoked sooner.  Performed at [facility], [place] [address] [place], [place] [address]      Time coordinating discharge: Over 30 minutes  SIGNED:   Tianah Lonardo J [place] DO  [facility] 11/06/2020, 9:04 AM

## 2020-11-06 NOTE — Discharge Instructions (Signed)
Pulmonary Embolism  A pulmonary embolism (PE) is a sudden blockage or decrease of blood flow in one or both lungs that happens when a clot travels into the arteries of the lung (pulmonary arteries). Most blockages come from a blood clot that forms in the vein of a leg or arm (deep vein thrombosis, DVT) and travels to the lungs. A clot is blood that has thickened into a gel or solid. PE is a dangerous and life-threatening condition that needs to be treated right away. What are the causes? This condition is usually caused by a blood clot that forms in a vein and moves to the lungs. In rare cases, it may be caused by air, fat, part of a tumor, or other tissue that moves through the veins and into the lungs. What increases the risk? The following factors may make you more likely to develop this condition:  Experiencing a traumatic injury, such as breaking a hip or leg.  Having: ? A spinal cord injury. ? Major surgery, especially hip or knee replacement, or surgery on parts of the nervous system or on the abdomen. ? A stroke. ? A blood clotting disease. ? Long-term (chronic) lung or heart disease. ? Cancer, especially if you are being treated with chemotherapy. ? A central venous catheter.  Taking medicines that contain estrogen. These include birth control pills and hormone replacement therapy.  Being: ? Pregnant. ? In the period of time after your baby is delivered (postpartum). ? Older than age 60. ? Overweight. ? A smoker, especially if you have other risks. ? Not very active (sedentary), not being able to move at all, or spending long periods sitting, such as travel over 6 hours. You are also at a greater risk if you have a leg in a cast or splint. What are the signs or symptoms? Symptoms of this condition usually start suddenly and include:  Shortness of breath during activity or at rest.  Coughing, coughing up blood, or coughing up bloody mucus.  Chest pain, back pain, or  shoulder blade pain that gets worse with deep breaths.  Rapid or irregular heartbeat.  Feeling light-headed or dizzy, or fainting.  Feeling anxious.  Pain and swelling in a leg. This is a symptom of DVT, which can lead to PE. How is this diagnosed? This condition may be diagnosed based on your medical history, a physical exam, and tests. Tests may include:  Blood tests.  An ECG (electrocardiogram) of the heart.  A CT pulmonary angiogram. This test checks blood flow in and around your lungs.  A ventilation-perfusion scan, also called a lung VQ scan. This test measures air flow and blood flow to the lungs.  An ultrasound to check for a DVT. How is this treated? Treatment for this condition depends on many factors, such as the cause of your PE, your risk for bleeding or developing more clots, and other medical conditions you may have. Treatment aims to stop blood clots from forming or growing larger. In some cases, treatment may be aimed at breaking apart or removing the blood clot. Treatment may include:  Medicines, such as: ? Blood thinning medicines, also called anticoagulants, to stop clots from forming and growing. ? Medicines that break apart clots (thrombolytics).  Procedures, such as: ? Using a flexible tube to remove a blood clot (embolectomy) or to deliver medicine to destroy it (catheter-directed thrombolysis). ? Surgery to remove the clot (surgical embolectomy). This is rare. You may need a combination of immediate, long-term, and extended   treatments. Your treatment may continue for several months (maintenance therapy) or longer depending on your medical conditions. You and your health care provider will work together to choose the treatment program that is best for you. Follow these instructions at home: Medicines  Take over-the-counter and prescription medicines only as told by your health care provider.  If you are taking blood thinners: ? Talk with your health care  provider before you take any medicines that contain aspirin or NSAIDs, such as ibuprofen. These medicines increase your risk for dangerous bleeding. ? Take your medicine exactly as told, at the same time every day. ? Avoid activities that could cause injury or bruising, and follow instructions about how to prevent falls. ? Wear a medical alert bracelet or carry a card that lists what medicines you take.  Understand what foods and drugs interact with any medicines that you are taking. General instructions  Ask your health care provider when you may return to your normal activities. Avoid sitting or lying for a long time without moving.  Maintain a healthy weight. Ask your health care provider what weight is healthy for you.  Do not use any products that contain nicotine or tobacco, such as cigarettes, e-cigarettes, and chewing tobacco. If you need help quitting, ask your health care provider.  Talk with your health care provider about any travel plans. It is important to make sure that you are still able to take your medicine while traveling.  Keep all follow-up visits as told by your health care provider. This is important. Where to find more information  American Lung Association: www.lung.org  Centers for Disease Control and Prevention: www.cdc.gov Contact a health care provider if:  You missed a dose of your blood thinner medicine. Get help right away if you:  Have: ? New or increased pain, swelling, warmth, or redness in an arm or leg. ? Shortness of breath that gets worse during activity or at rest. ? A fever. ? Worsening chest pain. ? A rapid or irregular heartbeat. ? A severe headache. ? Vision changes. ? A serious fall or accident, or you hit your head. ? Stomach pain. ? Blood in your vomit, stool, or urine. ? A cut that will not stop bleeding.  Cough up blood.  Feel light-headed or dizzy, and that feeling does not go away.  Cannot move your arms or legs.  Are  confused or have memory loss. These symptoms may represent a serious problem that is an emergency. Do not wait to see if the symptoms will go away. Get medical help right away. Call your local emergency services (911 in the [place] Do not drive yourself to the hospital. Summary  A pulmonary embolism (PE) is a serious and potentially life-threatening condition, in which a blood clot from one part of the body (deep vein thrombosis, DVT) travels to the arteries of the lung, causing a sudden blockage or decrease of blood flow to the lungs. This may result in shortness of breath, chest pain, dizziness, and fainting.  Treatments for this condition usually include medicines to thin your blood (anticoagulants) or medicines to break apart blood clots (thrombolytics).  If you are given blood thinners, take your medicine exactly as told by your health care provider, at the same time every day. This is important.  Understand what foods and drugs interact with any medicines that you are taking.  If you have signs of PE or DVT, call your local emergency services (911 in the [place] This information is not   intended to replace advice given to you by your health care provider. Make sure you discuss any questions you have with your health care provider. Document Revised: 08/15/2019 Document Reviewed: 08/15/2019 Elsevier Patient Education  2021 [employer]   Venous Thromboembolism Prevention Venous thromboembolism (VTE) is a condition in which a blood clot (thrombus) develops in the body. A deep vein thrombosis (DVT) is a blood clot that usually occurs in a deep vein in the leg or the pelvis, but it can also occur in the arm. Sometimes, pieces of a thrombus can break off and travel through the bloodstream to other parts of the body. When that happens, the thrombus is called an embolus. An embolus that travels to the lungs is called a pulmonary embolism. An embolism can block the blood flow in the blood vessels of  other organs as well. How can this condition affect me? VTE is a serious health condition that can cause disability or death. It is very important to get help right away. Do not ignore your symptoms. What can increase my risk? You are more likely to develop this condition if you:  Have had recent major surgery or trauma.  Have certain health conditions, such as cancer.  Are in the hospital for a sudden illness.  Have not moved for a long period of time, such as if you are on bed rest or traveling a long distance.  Take certain medicines, such as birth control pills or hormone replacement therapy.  Are pregnant or recently gave birth.  Have a personal or family history of VTE.  Are overweight.  Are 1 years of age or older.  Use products that contain nicotine and tobacco. What actions can I take to prevent this? In the hospital A VTE may be prevented by taking medicines that are prescribed to prevent blood clots (anticoagulants). You can also help to prevent VTE while in the hospital by taking these actions:  Get out of bed and walk. Ask your health care provider if this is safe for you to do.  Ask your health care provider if you should use a sequential compression device (SCD). This is a machine that pumps air into compression sleeves that are wrapped around your legs.  Ask your health care provider if you should wear tight, elastic stockings that apply pressure to the lower legs (compression stockings). Compression stockings are sometimes used with SCDs. At home after surgery Understand that you have an increased risk for VTE for the first 4-6 weeks after surgery. During this time:  Avoid traveling for more than 4 hours. If you must travel after surgery, ask your health care provider about additional preventive actions that you can take.  Avoid sitting or lying still for too long. If possible, get up and walk around one time every hour. Ask your health care provider when this  is safe for you to do. While traveling Travel that takes more than 4 hours can increase the risk of a VTE. To prevent VTE when traveling:  Exercise your arms and legs every hour. You can do this by standing, stretching, and bending and straightening your arms and legs. If you are traveling by airplane, train, or bus, walk up and down the aisle as often as possible to get your blood moving. If you are traveling by car, stop and get out of the car every hour to exercise your arms and legs and stretch. Other types of exercise might include: ? Keeping your feet flat on the ground  and raising your toes. ? Switching from tightening the muscles in your calves and thighs to relaxing those same muscles while you are sitting. ? Pointing and flexing your feet at the ankle joints while you are sitting.  Drink enough water to keep your urine pale yellow.  Avoid drinking alcohol during long travel. Generally, it is not recommended that you take medicines to prevent DVT during routine travel.   Activity  Stay active. Avoid sitting or lying in bed for long periods of time without moving your arms and legs.  Exercise by moving your arms and legs for 30 minutes or more every day.  Try not to bump or injure your legs.  Avoid crossing your legs when you are sitting.   Lifestyle  Do not use any products that contain nicotine or tobacco, such as cigarettes, e-cigarettes, and chewing tobacco. If you need help quitting, ask your health care provider. This is especially important if you take estrogen medicines.  Avoid wearing tight clothing around your legs or waist. General information  Wear compression stockings as told by your health care provider. These stockings help to prevent blood clots and reduce swelling in your legs. Do not let them bunch up when you are wearing them.  Avoid using medicines that contain estrogen if you do not need them. These include birth control pills and hormone replacement  therapy.  Do not use pillows under your knees while lying down unless told by your health care provider.  Maintain a healthy weight.   Where to find more information  Centers for Disease Control and Prevention: http://www.wolf.info/  American Heart Association: www.heart.org Get help right away if:  You have chest pain or shortness of breath.  You cough up blood.  You have a rapid or irregular heartbeat.  You feel light-headed or dizzy.  You have new or increased pain, swelling, or redness in an arm or leg.  You have numbness or tingling in an arm or leg.  You have blood in your vomit, stool, or urine. These symptoms may represent a serious problem that is an emergency. Do not wait to see if the symptoms will go away. Get medical help right away. Call your local emergency services (911 in the [place] Do not drive yourself to the hospital. Summary  Venous thromboembolism (VTE) is a condition in which a blood clot (thrombus)develops in the body. A deep vein thrombosis (DVT) is a blood clot that usually occurs in a deep vein in the leg or the pelvis, but it can also occur in the arm.  VTE is a serious health condition that can cause disability or death. It is very important to get help right away. Do not ignore your symptoms.  If you are traveling, exercise your arms and legs every hour.  Stay active to help prevent blood clots. Avoid sitting or lying in bed for long periods of time without moving your arms and legs. This information is not intended to replace advice given to you by your health care provider. Make sure you discuss any questions you have with your health care provider. Document Revised: 01/21/2019 Document Reviewed: 12/10/2018 Elsevier Patient Education  2021 [place] on my medicine - ELIQUIS (apixaban)  This medication education was reviewed with me or my healthcare representative as part of my discharge preparation.    Why was Eliquis prescribed  for you? Eliquis was prescribed to treat blood clots that may have been found in the veins of your legs (deep  vein thrombosis) or in your lungs (pulmonary embolism) and to reduce the risk of them occurring again.  What do You need to know about Eliquis ? The starting dose is 10 mg (two 5 mg tablets) taken TWICE daily for the FIRST SEVEN (7) DAYS, then on 11/13/20  the dose is reduced to ONE 5 mg tablet taken TWICE daily.  Eliquis may be taken with or without food.   Try to take the dose about the same time in the morning and in the evening. If you have difficulty swallowing the tablet whole please discuss with your pharmacist how to take the medication safely.  Take Eliquis exactly as prescribed and DO NOT stop taking Eliquis without talking to the doctor who prescribed the medication.  Stopping may increase your risk of developing a new blood clot.  Refill your prescription before you run out.  After discharge, you should have regular check-up appointments with your healthcare provider that is prescribing your Eliquis.    What do you do if you miss a dose? If a dose of ELIQUIS is not taken at the scheduled time, take it as soon as possible on the same day and twice-daily administration should be resumed. The dose should not be doubled to make up for a missed dose.  Important Safety Information A possible side effect of Eliquis is bleeding. You should call your healthcare provider right away if you experience any of the following: ? Bleeding from an injury or your nose that does not stop. ? Unusual colored urine (red or dark brown) or unusual colored stools (red or black). ? Unusual bruising for unknown reasons. ? A serious fall or if you hit your head (even if there is no bleeding).  Some medicines may interact with Eliquis and might increase your risk of bleeding or clotting while on Eliquis. To help avoid this, consult your healthcare provider or pharmacist prior to using any new  prescription or non-prescription medications, including herbals, vitamins, non-steroidal anti-inflammatory drugs (NSAIDs) and supplements.  This website has more information on Eliquis (apixaban): http://www.eliquis.com/eliquis/home

## 2020-12-01 ENCOUNTER — Encounter: Payer: Self-pay | Admitting: Physician Assistant

## 2020-12-01 ENCOUNTER — Other Ambulatory Visit: Payer: Self-pay

## 2020-12-01 ENCOUNTER — Ambulatory Visit: Payer: Medicare Other | Attending: Physician Assistant | Admitting: Physician Assistant

## 2020-12-01 VITALS — BP 129/81 | HR 78 | Resp 16 | Ht 73.0 in | Wt 191.6 lb

## 2020-12-01 DIAGNOSIS — Z87442 Personal history of urinary calculi: Secondary | ICD-10-CM | POA: Insufficient documentation

## 2020-12-01 DIAGNOSIS — G4700 Insomnia, unspecified: Secondary | ICD-10-CM | POA: Diagnosis not present

## 2020-12-01 DIAGNOSIS — Z716 Tobacco abuse counseling: Secondary | ICD-10-CM | POA: Insufficient documentation

## 2020-12-01 DIAGNOSIS — I824Y2 Acute embolism and thrombosis of unspecified deep veins of left proximal lower extremity: Secondary | ICD-10-CM | POA: Insufficient documentation

## 2020-12-01 DIAGNOSIS — F17290 Nicotine dependence, other tobacco product, uncomplicated: Secondary | ICD-10-CM | POA: Diagnosis not present

## 2020-12-01 DIAGNOSIS — Z7182 Exercise counseling: Secondary | ICD-10-CM | POA: Diagnosis not present

## 2020-12-01 DIAGNOSIS — E041 Nontoxic single thyroid nodule: Secondary | ICD-10-CM | POA: Insufficient documentation

## 2020-12-01 DIAGNOSIS — Z7289 Other problems related to lifestyle: Secondary | ICD-10-CM | POA: Diagnosis not present

## 2020-12-01 DIAGNOSIS — Z7901 Long term (current) use of anticoagulants: Secondary | ICD-10-CM | POA: Insufficient documentation

## 2020-12-01 DIAGNOSIS — I2699 Other pulmonary embolism without acute cor pulmonale: Secondary | ICD-10-CM | POA: Diagnosis not present

## 2020-12-01 DIAGNOSIS — I10 Essential (primary) hypertension: Secondary | ICD-10-CM | POA: Insufficient documentation

## 2020-12-01 DIAGNOSIS — Z09 Encounter for follow-up examination after completed treatment for conditions other than malignant neoplasm: Secondary | ICD-10-CM

## 2020-12-01 DIAGNOSIS — R778 Other specified abnormalities of plasma proteins: Secondary | ICD-10-CM | POA: Insufficient documentation

## 2020-12-01 DIAGNOSIS — Z720 Tobacco use: Secondary | ICD-10-CM

## 2020-12-01 MED ORDER — AMLODIPINE BESYLATE 10 MG PO TABS
10.0000 mg | ORAL_TABLET | Freq: Every day | ORAL | 0 refills | Status: DC
Start: 2020-12-01 — End: 2021-03-16

## 2020-12-01 MED ORDER — APIXABAN 5 MG PO TABS: ORAL_TABLET | ORAL | 4 refills | Status: DC

## 2020-12-01 MED ORDER — TRAZODONE HCL 50 MG PO TABS
25.0000 mg | ORAL_TABLET | Freq: Every evening | ORAL | 3 refills | Status: DC | PRN
Start: 2020-12-01 — End: 2021-03-16

## 2020-12-01 NOTE — Patient Instructions (Signed)
Deep Vein Thrombosis  Deep vein thrombosis (DVT) is a condition in which a blood clot forms in a deep vein, such as a vein in the lower leg, thigh, pelvis, or arm. Deep veins are veins in the deep venous system. A clot is blood that has thickened into a gel or solid. This condition is serious and can be life-threatening if the clot travels to the lungs and causes a blockage (pulmonary embolism) in the arteries of the lung. A DVT can also damage veins in the leg. This can lead to long-term, or chronic, venous disease, leg pain, swelling, discoloration, and ulcers or sores (post-thrombotic syndrome). What are the causes? This condition may be caused by:  A slowdown of blood flow.  Damage to a vein.  A condition that causes blood to clot more easily, such as certain blood-clotting disorders. What increases the risk? The following factors may make you more likely to develop this condition:  Having obesity.  Being older, especially older than age 60.  Being inactive (sedentary lifestyle) or not moving around. This may include: ? Sitting or lying down for longer than 4-6 hours other than to sleep at night. ? Being in the hospital, having major or lengthy surgery, or having a thin, flexible tube (central line catheter) placed in a large vein.  Being pregnant, giving birth, or having recently given birth.  Taking medicines that contain estrogen, such as birth control or hormone replacement therapy.  Using products that contain nicotine or tobacco, especially if you use hormonal birth control.  Having a history of blood clots or a blood-clotting disease, a blood vessel disease (peripheral vascular disease), or congestive heart disease.  Having a history of cancer, especially if being treated with chemotherapy. What are the signs or symptoms? Symptoms of this condition include:  Swelling, pain, pressure, or tenderness in an arm or a leg.  An arm or a leg becoming warm, red, or  discolored.  A leg turning very pale. You may have a large DVT. This is rare. If the clot is in your leg, you may notice symptoms more or have worse symptoms when you stand or walk. In some cases, there are no symptoms. How is this diagnosed? This condition is diagnosed with:  Your medical history and a physical exam.  Tests, such as: ? Blood tests to check how well your blood clots. ? Doppler ultrasound. This is the best way to find a DVT. ? Venogram. Contrast dye is injected into a vein, and X-rays are taken to check for clots. How is this treated? Treatment for this condition depends on:  The cause of your DVT.  The size and location of your DVT, or having more than one DVT.  Your risk for bleeding or developing more clots.  Other medical conditions you may have. Treatment may include:  Taking a blood thinner, also called an anticoagulant, to prevent clots from forming and growing.  Wearing compression stockings, if directed.  Injecting medicines into the affected vein to break up the clot (catheter-directed thrombolysis). This is used only for severe DVT and only if a specialist recommends it.  Specific surgical procedures, when DVT is severe or hard to treat. These may be done to: ? Isolate and remove your clot. ? Place an inferior vena cava (IVC) filter in a large vein to catch blood clots before they reach your lungs. You may get some medical treatments for 6 months or longer. Follow these instructions at home: If you are taking blood thinners:    Talk with your health care provider before you take any medicines that contain aspirin or NSAIDs, such as ibuprofen. These medicines increase your risk for dangerous bleeding.  Take your medicine exactly as told, at the same time every day. Do not skip a dose. Do not take more than the prescribed dose. This is important.  Ask your health care provider about foods and medicines that could change the way your blood thinner  works (may interact). Avoid these foods and medicines if you are told to do so.  Avoid anything that may cause bleeding or bruising. You may bleed more easily while taking blood thinners. ? Be very careful when using knives, scissors, or other sharp objects. ? Use an electric razor instead of a blade. ? Avoid activities that could cause injury or bruising, and follow instructions for preventing falls. ? Tell your health care provider if you have had any internal bleeding, bleeding ulcers, or neurologic diseases, such as strokes or cerebral aneurysms.  Wear a medical alert bracelet or carry a card that lists what medicines you take. General instructions  Take over-the-counter and prescription medicines only as told by your health care provider.  Return to your normal activities as told by your health care provider. Ask your health care provider what activities are safe for you.  If recommended, wear compression stockings as told by your health care provider. These stockings help to prevent blood clots and reduce swelling in your legs.  Keep all follow-up visits as told by your health care provider. This is important. Contact a health care provider if:  You miss a dose of your blood thinner.  You have new or worse pain, swelling, or redness in an arm or a leg.  You have worsening numbness or tingling in an arm or a leg.  You have unusual bruising. Get help right away if:  You have signs or symptoms that a blood clot has moved to the lungs. These may include: ? Shortness of breath. ? Chest pain. ? Fast or irregular heartbeats (palpitations). ? Light-headedness or dizziness. ? Coughing up blood.  You have signs or symptoms that your blood is too thin. These may include: ? Blood in your vomit, stool, or urine. ? A cut that will not stop bleeding. ? A menstrual period that is heavier than usual. ? A severe headache or confusion. These symptoms may represent a serious problem that  is an emergency. Do not wait to see if the symptoms will go away. Get medical help right away. Call your local emergency services (911 in the [place] Do not drive yourself to the hospital. Summary  Deep vein thrombosis (DVT) happens when a blood clot forms in a deep vein. This may occur in the lower leg, thigh, pelvis, or arm.  Symptoms affect the arm or leg and can include swelling, pain, tenderness, warmth, redness, or discoloration.  This condition may be treated with medicines or compression stockings. In severe cases, surgery may be done.  If you are taking blood thinners, take them exactly as told. Do not skip a dose. Do not take more than is prescribed.  Get help right away if you have shortness of breath, chest pain, fast or irregular heartbeats, or blood in your vomit, urine, or stool. This information is not intended to replace advice given to you by your health care provider. Make sure you discuss any questions you have with your health care provider. Document Revised: 09/27/2019 Document Reviewed: 09/27/2019 Elsevier Patient Education  2021 [employer]  

## 2020-12-01 NOTE — Progress Notes (Signed)
Patient ID: Alex Coffin Sr., male   DOB: 01-01-1966, 55 y.o.   MRN: [id number]      Alex Glover, is a 55 y.o. male  VCB:[id number]  BWG:[id number]  DOB - [birth year]  Subjective:  Chief Complaint and HPI: Alex Glover is a 55 y.o. male here today to establish care and for a follow up visit After hospitalization 1/20-1/22/2022  With SOB after L calf injury resulting in acute PE.  No CP.  No SOB.  energy level is at 90%.  Says he does not smoke cigs but smokes a cigar every 3 days. Compliant with meds. Also would like a medication to help with sleep.    From discharge summary:  Recommendations for Outpatient Follow-up:  1. [facility] as scheduled 2. Started on Eliquis for acute PE and age-indeterminate DVT 3. Will need further follow-up and work-up of thyroid nodule and anterior mediastinal nodular tissue.   4. Started on amlodipine 10 mg p.o. daily for blood pressure control 5. Please obtain BMP/CBC in one week  History of present illness:  Alex Scottis a 55 year old male with past medical history significant for nephrolithiasis, tobacco use disorder with occasional alcohol use who presented to ED with progressive shortness of breath over the past 2 days. Patient reports left leg injury 2 weeks ago when a tree branch struck his left calf. Patient also reports he received his third [employer] booster about 2 weeks ago. No sick contacts,no recent travel or prolonged immobilization.. Further denied headaches, no fever/chills/night sweats, no sore throat, no chest pain, no palpitations, no abdominal pain, no weakness, no fatigue.  Temperature 98.4, HR 83, RR 18, BP 151/103, SPO2 100% on room air. Sodium 136, potassium 3.8, chloride 101, CO2 25, glucose 94, BUN 9, creatinine 1.18. Troponin 88>70.WBC 7.8, hemoglobin 16.0, platelets 152. SARS-CoV-2 negative. Alex rhythm, rate 81, no concerning ST elevation/depression or T  wave inversion. Chest x-ray with mild right perihilar and left base subsegmental atelectasis and/or scarring, no acute infiltrate. CT chest angiogram with large central obstructing pulmonary embolism within the distal right main pulmonary artery extending into the right lower lobar/subsegmental pulmonary arteries and multiple clots extending into segmental branches of the left upper lobe, lingula and left lower lobe with signs of right heart strain. Patient was started on a heparin drip. Hospitalist service consulted for further evaluation and management of acute PE with CT findings of right heart strain.  Hospital course:  Acute pulmonary embolism Age indeterminant left lower extremity DVT Patient presenting to the ED with progressive shortness of breath over the past 2 days. Patient denies prolonged immobilization,but does note recent trauma to left calf when was struck by a large tree branch 2 weeks ago. CT angiogram chest notable for large central obstructing pulmonary embolism within the distal right main pulmonary artery extending into the right lower lobar/subsegmental pulmonary arteries and multiple clots extending into segmental branches of the left upper lobe, lingula and left lower lobe with signs of right heart strain. Vascular duplex ultrasound left lower extremity with age-indeterminate DVT left peroneal and popliteal vein.TTE with LVEF 60-65%, no LV regional wall motion normalities, mild concentric LVH, LV diastolic primers within normal limits, RV systolic function normal, IVC dilated in size.  Patient was initially started on a heparin drip which was transitioned to Eliquis.  No hypoxia noted even with exertion.  [facility] as scheduled.  Elevated troponin likely secondary to type II demand ischemia  Etiology likely secondary to acute PE as above.Troponin 88 followed by 70. EKG with no dynamic changes. Patient asymptomatic without chest pain or  palpitations.  TTE without LV wall motion abnormalities.  Right thyroid nodule Anterior mediastinum nodule tissue Incidental finding of large right thyroid nodule with increased nodule tissue anterior mediastinum. Differential diagnosis includes thymic nodular hyperplasia versus benign/malignant neoplasm. Discussed with patient findings and need for close outpatient follow-up with PCP. Would recommend outpatient thyroid ultrasound and potential cardiothoracic surgery referral for further evaluation and management.  Essential hypertension Started on amlodipine 10 mg p.o. daily   Discharge Diagnoses:  Principal Problem:   Acute pulmonary embolism ([place]) Active Problems:   Thyroid nodule   Essential hypertension   DVT (deep venous thrombosis) (HCC)   ED/Hospital notes reviewed.    ROS:   Constitutional:  No f/c, No night sweats, No unexplained weight loss. EENT:  No vision changes, No blurry vision, No hearing changes. No mouth, throat, or ear problems.  Respiratory: No cough, No SOB Cardiac: No CP, no palpitations GI:  No abd pain, No N/V/D. GU: No Urinary s/sx Musculoskeletal: No joint pain Neuro: No headache, no dizziness, no motor weakness.  Skin: No rash Endocrine:  No polydipsia. No polyuria.  Psych: Denies SI/HI  No problems updated.  ALLERGIES: No Known Allergies  PAST MEDICAL HISTORY: Past Medical History:  Diagnosis Date  . Kidney stone     MEDICATIONS AT HOME: Prior to Admission medications   Medication Sig Start Date End Date Taking? Authorizing Provider  ibuprofen (ADVIL) 200 MG tablet Take 400 mg by mouth every 6 (six) hours as needed for mild pain (or headaches).   Yes [provider]  Multiple Vitamins-Minerals (ONE-A-DAY MENS HEALTH FORMULA) TABS Take 1 tablet by mouth daily with breakfast.   Yes [provider]  traZODone (DESYREL) 50 MG tablet Take 0.5-1 tablets (25-50 mg total) by mouth at bedtime as needed for sleep. 12/01/20   Yes Freeman Caldron M, PA-C  amLODipine (NORVASC) 10 MG tablet Take 1 tablet (10 mg total) by mouth daily. 12/01/20 03/01/21  Argentina Donovan, PA-C  apixaban (ELIQUIS) 5 MG TABS tablet 1 tablet (5mg ) twice daily after completion of starter pack 12/01/20   Argentina Donovan, PA-C     Objective:  EXAM:   Vitals:   12/01/20 0942  BP: 129/81  Pulse: 78  Resp: 16  SpO2: 97%  Weight: 191 lb 9.6 oz (86.9 kg)  Height: 6\' 1"  (1.854 m)    General appearance : A&OX3. NAD. Non-toxic-appearing HEENT: Atraumatic and Normocephalic.  PERRLA. EOM intact.   Chest/Lungs:  Breathing-non-labored, Good air entry bilaterally, breath sounds normal without rales, rhonchi, or wheezing  CVS: S1 S2 regular, no murmurs, gallops, rubs  Extremities: Bilateral Lower Ext shows no edema, both legs are warm to touch with = pulse throughout Neurology:  CN II-XII grossly intact, Non focal.   Psych:  TP linear. J/I WNL. Normal speech. Appropriate eye contact and affect.  Skin:  No Rash  Data Review No results found for: HGBA1C   Assessment & Plan   1. Acute pulmonary embolism without acute cor pulmonale, unspecified pulmonary embolism type (HCC) - CBC with Differential/Platelet - apixaban (ELIQUIS) 5 MG TABS tablet; 1 tablet (5mg ) twice daily after completion of starter pack  Dispense: 60 tablet; Refill: 4  2. Thyroid nodule -thyroid u/s ordered Next Appropriate work up to be determined by PCP on assignment and after u/s results -  3. Essential hypertension Controlled-continue - Comprehensive metabolic  panel - amLODipine (NORVASC) 10 MG tablet; Take 1 tablet (10 mg total) by mouth daily.  Dispense: 90 tablet; Refill: 0  4. Deep vein thrombosis (DVT) of proximal vein of left lower extremity, unspecified chronicity (HCC) - CBC with Differential/Platelet - Comprehensive metabolic panel - apixaban (ELIQUIS) 5 MG TABS tablet; 1 tablet (5mg ) twice daily after completion of starter pack  Dispense: 60 tablet;  Refill: 4  5. Hospital discharge follow-up  6. Insomnia, unspecified type - traZODone (DESYREL) 50 MG tablet; Take 0.5-1 tablets (25-50 mg total) by mouth at bedtime as needed for sleep.  Dispense: 30 tablet; Refill: 3  7. Tobacco abuse Smoking and dangers of nicotine have been discussed at length. Long term health consequences of smoking reviewed in detail.  Methods for helping with cessation have been reviewed.  Patient expresses understanding.      Patient have been counseled extensively about nutrition and exercise  Return in about 6 weeks (around 01/12/2021) for assign PCP and f/up blood clot and htn.  The patient was given clear instructions to go to ER or return to medical center if symptoms don't improve, worsen or new problems develop. The patient verbalized understanding. The patient was told to call to get lab results if they haven't heard anything in the next week.     Freeman Caldron, PA-C [facility] and [facility] [place], [place]   12/01/2020, 12:16 PM

## 2020-12-02 LAB — COMPREHENSIVE METABOLIC PANEL
ALT: 13 IU/L (ref 0–44)
AST: 21 IU/L (ref 0–40)
Albumin/Globulin Ratio: 1.3 (ref 1.2–2.2)
Albumin: 4.5 g/dL (ref 3.8–4.9)
Alkaline Phosphatase: 66 IU/L (ref 44–121)
BUN/Creatinine Ratio: 8 — ABNORMAL LOW (ref 9–20)
BUN: 8 mg/dL (ref 6–24)
Bilirubin Total: 0.3 mg/dL (ref 0.0–1.2)
CO2: 22 mmol/L (ref 20–29)
Calcium: 9.7 mg/dL (ref 8.7–10.2)
Chloride: 101 mmol/L (ref 96–106)
Creatinine, Ser: 0.97 mg/dL (ref 0.76–1.27)
GFR calc Af Amer: 103 mL/min/{1.73_m2} (ref >59)
GFR calc non Af Amer: 89 mL/min/{1.73_m2} (ref >59)
Globulin, Total: 3.4 g/dL (ref 1.5–4.5)
Glucose: 95 mg/dL (ref 65–99)
Potassium: 4.4 mmol/L (ref 3.5–5.2)
Sodium: 139 mmol/L (ref 134–144)
Total Protein: 7.9 g/dL (ref 6.0–8.5)

## 2020-12-02 LAB — CBC WITH DIFFERENTIAL/PLATELET
Basophils Absolute: 0 10*3/uL (ref 0.0–0.2)
Basos: 1 %
EOS (ABSOLUTE): 0.1 10*3/uL (ref 0.0–0.4)
Eos: 2 %
Hematocrit: 44.1 % (ref 37.5–51.0)
Hemoglobin: 14.9 g/dL (ref 13.0–17.7)
Immature Grans (Abs): 0 10*3/uL (ref 0.0–0.1)
Immature Granulocytes: 1 %
Lymphocytes Absolute: 1.7 10*3/uL (ref 0.7–3.1)
Lymphs: 28 %
MCH: 34.4 pg — ABNORMAL HIGH (ref 26.6–33.0)
MCHC: 33.8 g/dL (ref 31.5–35.7)
MCV: 102 fL — ABNORMAL HIGH (ref 79–97)
Monocytes Absolute: 0.5 10*3/uL (ref 0.1–0.9)
Monocytes: 9 %
Neutrophils Absolute: 3.5 10*3/uL (ref 1.4–7.0)
Neutrophils: 59 %
Platelets: 184 10*3/uL (ref 150–450)
RBC: 4.33 x10E6/uL (ref 4.14–5.80)
RDW: 12 % (ref 11.6–15.4)
WBC: 6 10*3/uL (ref 3.4–10.8)

## 2020-12-10 ENCOUNTER — Encounter: Payer: Self-pay | Admitting: *Deleted

## 2020-12-15 ENCOUNTER — Other Ambulatory Visit: Payer: Self-pay | Admitting: Physician Assistant

## 2020-12-15 DIAGNOSIS — I824Y2 Acute embolism and thrombosis of unspecified deep veins of left proximal lower extremity: Secondary | ICD-10-CM

## 2020-12-15 MED ORDER — APIXABAN 5 MG PO TABS: ORAL_TABLET | ORAL | 4 refills | Status: DC

## 2020-12-15 NOTE — Telephone Encounter (Signed)
Medication Refill - Medication: Eliquis  Has the patient contacted their pharmacy? Yes.   Pt states that Pharmacy told him if he did not come and pick up medication within a certain amount of time he could not have it. He states that he was out of town at the time. Please advise.  (Agent: If no, request that the patient contact the pharmacy for the refill.) (Agent: If yes, when and what did the pharmacy advise?)  Preferred Pharmacy (with phone number or street name):  [address], [place] [address]  Phone: [phone] Fax: [phone]  Hours: Not open 24 hours     Agent: Please be advised that RX refills may take up to 3 business days. We ask that you follow-up with your pharmacy.

## 2021-01-12 ENCOUNTER — Ambulatory Visit: Payer: Medicare Other | Attending: Family Medicine | Admitting: Family Medicine

## 2021-01-12 ENCOUNTER — Other Ambulatory Visit: Payer: Self-pay | Admitting: Family Medicine

## 2021-01-12 ENCOUNTER — Encounter: Payer: Self-pay | Admitting: Family Medicine

## 2021-01-12 ENCOUNTER — Other Ambulatory Visit: Payer: Self-pay

## 2021-01-12 VITALS — BP 129/83 | HR 70 | Ht 73.0 in | Wt 191.0 lb

## 2021-01-12 DIAGNOSIS — J9859 Other diseases of mediastinum, not elsewhere classified: Secondary | ICD-10-CM | POA: Insufficient documentation

## 2021-01-12 DIAGNOSIS — E041 Nontoxic single thyroid nodule: Secondary | ICD-10-CM | POA: Insufficient documentation

## 2021-01-12 DIAGNOSIS — Z7901 Long term (current) use of anticoagulants: Secondary | ICD-10-CM | POA: Insufficient documentation

## 2021-01-12 DIAGNOSIS — I2699 Other pulmonary embolism without acute cor pulmonale: Secondary | ICD-10-CM | POA: Diagnosis not present

## 2021-01-12 DIAGNOSIS — Z79899 Other long term (current) drug therapy: Secondary | ICD-10-CM | POA: Insufficient documentation

## 2021-01-12 DIAGNOSIS — Z86718 Personal history of other venous thrombosis and embolism: Secondary | ICD-10-CM | POA: Diagnosis not present

## 2021-01-12 DIAGNOSIS — I10 Essential (primary) hypertension: Secondary | ICD-10-CM | POA: Diagnosis not present

## 2021-01-12 DIAGNOSIS — I824Y2 Acute embolism and thrombosis of unspecified deep veins of left proximal lower extremity: Secondary | ICD-10-CM | POA: Diagnosis not present

## 2021-01-12 MED ORDER — APIXABAN 5 MG PO TABS: ORAL_TABLET | ORAL | 1 refills | Status: DC

## 2021-01-12 MED FILL — !ELIQUIS 5 MG TABLET: 5 | 30 days supply | Qty: 60 | Fill #0

## 2021-01-12 NOTE — Progress Notes (Signed)
Subjective:  Patient ID: Alex Coffin Sr., male    DOB: 1966-01-17  Age: 55 y.o. MRN: [id number]  CC: Establish Care   HPI Alex BINSTOCK Sr. is a 54 year old male with a history of hypertension, PE, DVT who presents today to establish care.  He was hospitalized in 10/2020 for acute PE and left lower extremity DVT.  He was placed on Eliquis for anticoagulation. CTA of the chest had revealed IMPRESSION: 1. Examination is positive for acute pulmonary embolus with CT evidence of right heart strain (RV/LV Ratio 1.19) consistent with at least submassive (intermediate risk) PE. The presence of right heart strain has been associated with an increased risk of morbidity and mortality. 2. Large low-attenuation nodule within the right lobe of thyroid gland. Recommend thyroid US (ref: J Am Coll Radiol. 2015 Feb;12(2): 143-50). 3. Increased nodular soft tissue within the anterior mediastinum. Primary differential considerations include thymic nodular or lymphoid hyperplasia versus benign or malignant neoplasm. Recommend referral to thoracic surgery for further management. 4. Critical Value/emergent results were called by telephone at the time of interpretation on 11/04/2020 at 4:33 pm to provider Blanchie Dessert, who verbally acknowledged these results.  Last month he had a follow-up visit with a physician assistant for hospital follow-up. Ultrasound of the thyroid was ordered but he is yet to undergo this. He has been without Eliquis for a month and was informed he had to pay $500 He has noticed hematochezia, fainting and his cough 'looked like [place]' and he did not bother resuming Eliquis as he had read dose with some adverse effects of Eliquis. He has no pain in his leg, denies chest pain or dyspnea. Past Medical History:  Diagnosis Date  . Kidney stone     No past surgical history on file.  No family history on file.  No Known Allergies  Outpatient Medications Prior to Visit   Medication Sig Dispense Refill  . amLODipine (NORVASC) 10 MG tablet Take 1 tablet (10 mg total) by mouth daily. 90 tablet 0  . ibuprofen (ADVIL) 200 MG tablet Take 400 mg by mouth every 6 (six) hours as needed for mild pain (or headaches).    Marland Kitchen apixaban (ELIQUIS) 5 MG TABS tablet 1 tablet (5mg ) twice daily after completion of starter pack (Patient not taking: Reported on 01/12/2021) 60 tablet 4  . Multiple Vitamins-Minerals (ONE-A-DAY MENS HEALTH FORMULA) TABS Take 1 tablet by mouth daily with breakfast. (Patient not taking: Reported on 01/12/2021)    . traZODone (DESYREL) 50 MG tablet Take 0.5-1 tablets (25-50 mg total) by mouth at bedtime as needed for sleep. (Patient not taking: Reported on 01/12/2021) 30 tablet 3   No facility-administered medications prior to visit.     ROS Review of Systems  Constitutional: Negative for activity change and appetite change.  HENT: Negative for sinus pressure and sore throat.   Eyes: Negative for visual disturbance.  Respiratory: Negative for cough, chest tightness and shortness of breath.   Cardiovascular: Negative for chest pain and leg swelling.  Gastrointestinal: Negative for abdominal distention, abdominal pain, constipation and diarrhea.  Endocrine: Negative.   Genitourinary: Negative for dysuria.  Musculoskeletal: Negative for joint swelling and myalgias.  Skin: Negative for rash.  Allergic/Immunologic: Negative.   Neurological: Negative for weakness, light-headedness and numbness.  Psychiatric/Behavioral: Negative for dysphoric mood and suicidal ideas.    Objective:  BP 129/83 (BP Location: Left Arm, Patient Position: Sitting)   Pulse 70   Ht 6\' 1"  (1.854 m)   Wt 191 lb (  86.6 kg)   SpO2 100%   BMI 25.20 kg/m   BP/Weight 01/12/2021 12/01/2020 1/49/7026  Systolic BP 378 588 502  Diastolic BP 83 81 91  Wt. (Lbs) 191 191.6 -  BMI 25.2 25.28 -      Physical Exam Constitutional:      Appearance: He is well-developed.  Neck:      Vascular: No JVD.  Cardiovascular:     Rate and Rhythm: Normal rate.     Heart sounds: Normal heart sounds. No murmur heard.   Pulmonary:     Effort: Pulmonary effort is normal.     Breath sounds: Normal breath sounds. No wheezing or rales.  Chest:     Chest wall: No tenderness.  Abdominal:     General: Bowel sounds are normal. There is no distension.     Palpations: Abdomen is soft. There is no mass.     Tenderness: There is no abdominal tenderness.  Musculoskeletal:        General: Normal range of motion.     Right lower leg: No edema.     Left lower leg: No edema.  Neurological:     Mental Status: He is alert and oriented to person, place, and time.  Psychiatric:        Mood and Affect: Mood normal.     CMP Latest Ref Rng & Units 12/01/2020 11/05/2020 11/04/2020  Glucose 65 - 99 mg/dL 95 84 94  BUN 6 - 24 mg/dL 8 13 9   Creatinine 0.76 - 1.27 mg/dL 0.97 0.94 1.18  Sodium 134 - 144 mmol/L 139 135 136  Potassium 3.5 - 5.2 mmol/L 4.4 3.6 3.8  Chloride 96 - 106 mmol/L 101 104 101  CO2 20 - 29 mmol/L 22 22 25   Calcium 8.7 - 10.2 mg/dL 9.7 8.8(L) 9.4  Total Protein 6.0 - 8.5 g/dL 7.9 7.0 -  Total Bilirubin 0.0 - 1.2 mg/dL 0.3 1.0 -  Alkaline Phos 44 - 121 IU/L 66 57 -  AST 0 - 40 IU/L 21 17 -  ALT 0 - 44 IU/L 13 14 -    Lipid Panel  No results found for: CHOL, TRIG, HDL, CHOLHDL, VLDL, LDLCALC, LDLDIRECT  CBC    Component Value Date/Time   WBC 6.0 12/01/2020 1010   WBC 6.6 11/06/2020 0255   RBC 4.33 12/01/2020 1010   RBC 3.98 (L) 11/06/2020 0255   HGB 14.9 12/01/2020 1010   HCT 44.1 12/01/2020 1010   PLT 184 12/01/2020 1010   MCV 102 (H) 12/01/2020 1010   MCH 34.4 (H) 12/01/2020 1010   MCH 34.9 (H) 11/06/2020 0255   MCHC 33.8 12/01/2020 1010   MCHC 34.2 11/06/2020 0255   RDW 12.0 12/01/2020 1010   LYMPHSABS 1.7 12/01/2020 1010   EOSABS 0.1 12/01/2020 1010   BASOSABS 0.0 12/01/2020 1010    No results found for: HGBA1C  Assessment & Plan:  1. Deep vein  thrombosis (DVT) of proximal vein of left lower extremity, unspecified chronicity ([place]) He has been without Eliquis for the last 1 month I have resumed this. He would need to continue with anticoagulation until 02/02/2021 - apixaban (ELIQUIS) 5 MG TABS tablet; 1 tablet (5mg ) twice daily after completion of starter pack  Dispense: 60 tablet; Refill: 1  2. Acute pulmonary embolism without acute cor pulmonale, unspecified pulmonary embolism type ([place]) See #1 above  3. Mediastinal mass Incidental finding on CTA chest - Ambulatory referral to Cardiothoracic Surgery  4. Essential hypertension Controlled on amlodipine Counseled on  blood pressure goal of less than 130/80, low-sodium, DASH diet, medication compliance, 150 minutes of moderate intensity exercise per week. Discussed medication compliance, adverse effects.   5. Thyroid nodule We have scheduled his ultrasound of the thyroid    No orders of the defined types were placed in this encounter.   Return in about 2 months (around 03/14/2021) for Coordination of care.       Charlott Rakes, MD, FAAFP. [facility] and [facility], [place]   01/12/2021, 10:44 AM

## 2021-01-12 NOTE — Progress Notes (Signed)
Pt has history of hypertension and blood clots

## 2021-01-14 ENCOUNTER — Encounter: Payer: Self-pay | Admitting: Thoracic Surgery (Cardiothoracic Vascular Surgery)

## 2021-01-14 ENCOUNTER — Other Ambulatory Visit: Payer: Self-pay

## 2021-01-14 ENCOUNTER — Other Ambulatory Visit: Payer: Self-pay | Admitting: Thoracic Surgery (Cardiothoracic Vascular Surgery)

## 2021-01-14 ENCOUNTER — Institutional Professional Consult (permissible substitution) (INDEPENDENT_AMBULATORY_CARE_PROVIDER_SITE_OTHER): Payer: Medicare Other | Admitting: Thoracic Surgery (Cardiothoracic Vascular Surgery)

## 2021-01-14 VITALS — BP 132/86 | HR 78 | Resp 20 | Ht 73.0 in | Wt 190.0 lb

## 2021-01-14 DIAGNOSIS — J9859 Other diseases of mediastinum, not elsewhere classified: Secondary | ICD-10-CM

## 2021-01-14 NOTE — Progress Notes (Signed)
[address]       [place] [address]             [phone]                    Alex L Magallanes Sr. [place] Record #[id number] Date of Birth: [birth year]  Referring: Charlott Rakes, MD Primary Care: System, Provider Not In Primary Cardiologist: No primary care provider on file.  Chief Complaint:    Chief Complaint  Patient presents with  . Mediastinal Mass    Surgical consult, CTA Chest 11/04/20, ECHO 11/05/20    History of Present Illness:    Alex CADDELL Sr. 55 y.o. male referred for surgical evaluation of a mediastinal mass that was discovered incidentally.  Back in January of this year he presented to the emergency department with acute onset short of breath and was noted to have a pulmonary embolus.  Cross-sectional imaging identified some fullness and nodularity to his thymus gland.  He denies any neurologic or musculoskeletal issues.  He denies any ocular symptoms.  He did lose weight when he was in the hospital originally but has gained most of that back.    Past Medical History:  Diagnosis Date  . Kidney stone     No past surgical history on file.  No family history on file.   Social History   Tobacco Use  Smoking Status Current Some Day Smoker  Smokeless Tobacco Never Used    Social History   Substance and Sexual Activity  Alcohol Use No     No Known Allergies  Current Outpatient Medications  Medication Sig Dispense Refill  . amLODipine (NORVASC) 10 MG tablet Take 1 tablet (10 mg total) by mouth daily. 90 tablet 0  . apixaban (ELIQUIS) 5 MG TABS tablet 1 tablet (5mg ) twice daily after completion of starter pack 60 tablet 1  . ibuprofen (ADVIL) 200 MG tablet Take 400 mg by mouth every 6 (six) hours as needed for mild pain (or headaches).    . Multiple Vitamins-Minerals (ONE-A-DAY MENS HEALTH FORMULA) TABS Take 1 tablet by mouth daily with breakfast.    . traZODone (DESYREL) 50 MG tablet Take 0.5-1 tablets (25-50 mg  total) by mouth at bedtime as needed for sleep. 30 tablet 3   No current facility-administered medications for this visit.    Review of Systems  Constitutional: Negative for malaise/fatigue.  HENT: Negative.   Respiratory: Negative.   Cardiovascular: Negative.   Musculoskeletal: Negative.   Neurological: Negative for dizziness, focal weakness and weakness.    PHYSICAL EXAMINATION: BP 132/86 (BP Location: Left Arm, Patient Position: Sitting)   Pulse 78   Resp 20   Ht 6\' 1"  (1.854 m)   Wt 190 lb (86.2 kg)   SpO2 97% Comment: RA  BMI 25.07 kg/m   Physical Exam Constitutional:      General: He is not in acute distress.    Appearance: Normal appearance. He is normal weight. He is not ill-appearing.  Eyes:     Extraocular Movements: Extraocular movements intact.  Cardiovascular:     Rate and Rhythm: Normal rate.  Pulmonary:     Effort: Pulmonary effort is normal. No respiratory distress.  Abdominal:     General: Abdomen is flat. There is no distension.  Musculoskeletal:        General: Normal range of motion.     Cervical back: Normal range of motion.  Skin:    General: Skin  is warm and dry.  Neurological:     General: No focal deficit present.     Mental Status: He is alert and oriented to person, place, and time.      Diagnostic Studies & Laboratory data:     Recent Radiology Findings:   No results found.     I have independently reviewed the above radiology studies  and reviewed the findings with the patient.   Recent Lab Findings: Lab Results  Component Value Date   WBC 6.0 12/01/2020   HGB 14.9 12/01/2020   HCT 44.1 12/01/2020   PLT 184 12/01/2020   GLUCOSE 95 12/01/2020   ALT 13 12/01/2020   AST 21 12/01/2020   NA 139 12/01/2020   K 4.4 12/01/2020   CL 101 12/01/2020   CREATININE 0.97 12/01/2020   BUN 8 12/01/2020   CO2 22 12/01/2020       Problem List: Anterior mediastinal mass with increased nodularity. 3.7 cm right thyroid  nodule  Assessment / Plan:   55 year old male with an anterior mediastinal nodularity.  Significance is indeterminate.  I have ordered a repeat CT scan in the next 2 weeks.  If this remains present then we will order specific tumor markers.  In regards to his thyroid nodule and ultrasound is already been ordered by his primary care physician.  I will see him back following the CT scan.      Lajuana Matte 01/14/2021 1:55 PM

## 2021-01-19 ENCOUNTER — Ambulatory Visit (HOSPITAL_COMMUNITY)
Admission: RE | Admit: 2021-01-19 | Discharge: 2021-01-19 | Disposition: A | Discharge status: Home / Self Care | Payer: Medicare Other | Source: Ambulatory Visit | Attending: Physician Assistant | Admitting: Physician Assistant

## 2021-01-19 ENCOUNTER — Other Ambulatory Visit: Payer: Self-pay

## 2021-01-19 DIAGNOSIS — E041 Nontoxic single thyroid nodule: Secondary | ICD-10-CM | POA: Diagnosis not present

## 2021-01-20 ENCOUNTER — Ambulatory Visit (HOSPITAL_COMMUNITY)
Admission: RE | Admit: 2021-01-20 | Discharge: 2021-01-20 | Disposition: A | Discharge status: Home / Self Care | Payer: Medicare Other | Source: Ambulatory Visit | Attending: Thoracic Surgery (Cardiothoracic Vascular Surgery) | Admitting: Thoracic Surgery (Cardiothoracic Vascular Surgery)

## 2021-01-20 DIAGNOSIS — J9859 Other diseases of mediastinum, not elsewhere classified: Secondary | ICD-10-CM | POA: Insufficient documentation

## 2021-01-20 LAB — POCT I-STAT CREATININE: Creatinine, Ser: 1.1 mg/dL (ref 0.61–1.24)

## 2021-01-20 MED ORDER — IOHEXOL 300 MG/ML  SOLN
75.0000 mL | Freq: Once | INTRAMUSCULAR | Status: AC | PRN
  Administered 2021-01-20: 75 mL via INTRAVENOUS

## 2021-01-26 ENCOUNTER — Other Ambulatory Visit: Payer: Self-pay | Admitting: Physician Assistant

## 2021-01-26 DIAGNOSIS — E0789 Other specified disorders of thyroid: Secondary | ICD-10-CM

## 2021-01-26 DIAGNOSIS — E079 Disorder of thyroid, unspecified: Secondary | ICD-10-CM

## 2021-01-27 ENCOUNTER — Other Ambulatory Visit: Payer: Self-pay

## 2021-01-27 ENCOUNTER — Telehealth (INDEPENDENT_AMBULATORY_CARE_PROVIDER_SITE_OTHER): Payer: Medicare Other | Admitting: Thoracic Surgery (Cardiothoracic Vascular Surgery)

## 2021-01-27 DIAGNOSIS — J9859 Other diseases of mediastinum, not elsewhere classified: Secondary | ICD-10-CM

## 2021-01-27 NOTE — Progress Notes (Signed)
     [address]       [place] [address]             [phone]       Patient: Alex Glover Provider: Office Consent for Telemedicine visit obtained.  Today's visit was completed via a real-time telehealth (see specific modality noted below). The patient/authorized person provided oral consent at the time of the visit to engage in a telemedicine encounter with the present provider at [facility]. The patient/authorized person was informed of the potential benefits, limitations, and risks of telemedicine. The patient/authorized person expressed understanding that the laws that protect confidentiality also apply to telemedicine. The patient/authorized person acknowledged understanding that telemedicine does not provide emergency services and that he or she would need to call 911 or proceed to the nearest hospital for help if such a need arose.  . Total time spent in the clinical discussion 10 minutes. . Telehealth Modality: Phone visit (audio only)  I had a telephone visit with Alex Glover.  I discussed the results of his recent CT chest.  The mediastinal mass appears similar in appearance compared to the one 3 months ago.  This gives suspicion for a potential neoplasm.  I have ordered tumor markers for him.  He is currently undergoing evaluation for his thyroid thus I did not include thyroid studies.  He did complain of some weakness after receiving the contrast for the CT scan thus I will wait to order the PET scan after the thyroid function studies are resulted.  Touch base with them following his labs.  Alex Glover Alex Glover

## 2021-01-27 NOTE — Addendum Note (Signed)
Addended by: Lajuana Matte on: 01/27/2021 04:18 PM   Modules accepted: Orders

## 2021-01-31 ENCOUNTER — Encounter: Payer: Self-pay | Admitting: *Deleted

## 2021-02-04 ENCOUNTER — Other Ambulatory Visit: Payer: Self-pay | Admitting: *Deleted

## 2021-02-10 ENCOUNTER — Other Ambulatory Visit: Payer: Self-pay

## 2021-03-16 ENCOUNTER — Encounter: Payer: Self-pay | Admitting: Family Medicine

## 2021-03-16 ENCOUNTER — Ambulatory Visit: Payer: Medicare Other | Attending: Family Medicine | Admitting: Family Medicine

## 2021-03-16 ENCOUNTER — Other Ambulatory Visit: Payer: Self-pay

## 2021-03-16 VITALS — BP 116/75 | HR 66 | Ht 73.0 in | Wt 192.8 lb

## 2021-03-16 DIAGNOSIS — R202 Paresthesia of skin: Secondary | ICD-10-CM

## 2021-03-16 DIAGNOSIS — E041 Nontoxic single thyroid nodule: Secondary | ICD-10-CM

## 2021-03-16 DIAGNOSIS — Z86711 Personal history of pulmonary embolism: Secondary | ICD-10-CM | POA: Diagnosis not present

## 2021-03-16 DIAGNOSIS — E35 Disorders of endocrine glands in diseases classified elsewhere: Secondary | ICD-10-CM

## 2021-03-16 DIAGNOSIS — Z131 Encounter for screening for diabetes mellitus: Secondary | ICD-10-CM

## 2021-03-16 DIAGNOSIS — R222 Localized swelling, mass and lump, trunk: Secondary | ICD-10-CM | POA: Insufficient documentation

## 2021-03-16 DIAGNOSIS — Z86718 Personal history of other venous thrombosis and embolism: Secondary | ICD-10-CM | POA: Diagnosis not present

## 2021-03-16 DIAGNOSIS — Z79899 Other long term (current) drug therapy: Secondary | ICD-10-CM | POA: Diagnosis not present

## 2021-03-16 DIAGNOSIS — I10 Essential (primary) hypertension: Secondary | ICD-10-CM | POA: Diagnosis not present

## 2021-03-16 LAB — POCT GLYCOSYLATED HEMOGLOBIN (HGB A1C): Hemoglobin A1C: 4.9 % (ref 4.0–5.6)

## 2021-03-16 MED ORDER — GABAPENTIN 300 MG PO CAPS
300.0000 mg | ORAL_CAPSULE | Freq: Every day | ORAL | 3 refills | Status: DC
Start: 2021-03-16 — End: 2021-06-23
  Filled 2021-03-16: qty 30, 30d supply, fill #0

## 2021-03-16 MED ORDER — AMLODIPINE BESYLATE 10 MG PO TABS
10.0000 mg | ORAL_TABLET | Freq: Every day | ORAL | 1 refills | Status: DC
  Filled 2021-03-16: qty 30, 30d supply, fill #0
  Filled 2021-09-28: qty 30, 30d supply, fill #1

## 2021-03-16 NOTE — Progress Notes (Signed)
Feel needles and pins in his legs. A1C-4.9

## 2021-03-16 NOTE — Patient Instructions (Signed)
Paresthesia Paresthesia is a burning or prickling feeling. This feeling can happen in any part of the body. It often happens in the hands, arms, legs, or feet. Usually, it is not painful. In most cases, the feeling goes away in a short time and is not a sign of a serious problem. If you have paresthesia that lasts a long time, you need to be seen by your doctor. Follow these instructions at home: Alcohol use  Do not drink alcohol if: ? Your doctor tells you not to drink. ? You are pregnant, may be pregnant, or are planning to become pregnant.  If you drink alcohol: ? Limit how much you use to:  0-1 drink a day for women.  0-2 drinks a day for men. ? Be aware of how much alcohol is in your drink. In the [place] one drink equals one 12 oz bottle of beer (355 mL), one 5 oz glass of wine (148 mL), or one 1 oz glass of hard liquor (44 mL).   Nutrition  Eat a healthy diet. This includes: ? Eating foods that have a lot of fiber in them, such as fresh fruits and vegetables, whole grains, and beans. ? Limiting foods that have a lot of fat and processed sugars in them, such as fried or sweet foods.   General instructions  Take over-the-counter and prescription medicines only as told by your doctor.  Do not use any products that have nicotine or tobacco in them, such as cigarettes and e-cigarettes. If you need help quitting, ask your doctor.  If you have diabetes, work with your doctor to make sure your blood sugar stays in a healthy range.  If your feet feel numb: ? Check for redness, warmth, and swelling every day. ? Wear padded socks and comfortable shoes. These help protect your feet.  Keep all follow-up visits as told by your doctor. This is important. Contact a doctor if:  You have paresthesia that gets worse or does not go away.  Lose feeling (have numbness) after an injury.  Your burning or prickling feeling gets worse when you walk.  You have pain or cramps.  You feel dizzy  or pass out (faint).  You have a rash. Get help right away if you:  Feel weak or have new weakness in an arm or leg.  Have trouble walking or moving.  Have problems speaking, understanding, or seeing.  Feel confused.  Cannot control when you pee (urinate) or poop (have a bowel movement). Summary  Paresthesia is a burning or prickling feeling. It often happens in the hands, arms, legs, or feet.  In most cases, the feeling goes away in a short time and is not a sign of a serious problem.  If you have paresthesia that lasts a long time, you need to be seen by your doctor. This information is not intended to replace advice given to you by your health care provider. Make sure you discuss any questions you have with your health care provider. Document Revised: 07/13/2020 Document Reviewed: 07/13/2020 Elsevier Patient Education  2021 [employer].

## 2021-03-16 NOTE — Progress Notes (Signed)
Subjective:  Patient ID: Alex Coffin Sr., male    DOB: 1966-09-01  Age: 55 y.o. MRN: [id number]  CC: Hypertension   HPI Alex PELLOW Sr.  is a 55 year old male with a history of hypertension, PE, DVT  in 10/2020 for acute PE and left lower extremity DVT.  He was placed on Eliquis for anticoagulation and has completed his course of anticoagulation. 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  He also had a thyroid nodule for which he has been referred for thyroid ultrasound.  Interval History: He had a telehealth visit with cardiac surgery on 01/2021 and notes reveal suspicion for neoplasm from his recent CT chest.  Patient informs me he was told everything was normal.  He has not followed up with cardiac surgeries after his first visit. CT chest with contrast revealed: IMPRESSION: 1. No acute intrathoracic pathology. 2. Nodular tissue in the anterior mediastinum as seen previously may represent thymic hyperplasia versus a neoplastic process. Overall no significant interval change since the prior CT. 3. A 15 mm stone or focal parenchymal calcification in the posterior interpolar left kidney.  He had an ultrasound of his right thyroid which revealed: IMPRESSION: Right thyroid nodule measuring up to 4.5 cm. This is a TR 4 nodule. Recommend biopsy of this nodule.  His chart review indicates messages were left for this patient that he was being referred for a biopsy however patient states he was unaware of this. He complains of 'needles in his legs which feel like somethig crawling on him intermittently. He drinks alcohol every now and then which he thinks is not much.  Past Medical History:  Diagnosis Date  . Kidney stone     History reviewed. No pertinent surgical history.  History reviewed. No pertinent family history.  No Known Allergies  Outpatient Medications Prior to Visit  Medication Sig Dispense Refill  .  apixaban (ELIQUIS) 5 MG TABS tablet TAKE 1 TABLET BY MOUTH TWICE DAILY AFTER COMPLETION OF STARTER PACK. 60 tablet 1  . ibuprofen (ADVIL) 200 MG tablet Take 400 mg by mouth every 6 (six) hours as needed for mild pain (or headaches). (Patient not taking: Reported on 03/16/2021)    . Multiple Vitamins-Minerals (ONE-A-DAY MENS HEALTH FORMULA) TABS Take 1 tablet by mouth daily with breakfast. (Patient not taking: Reported on 03/16/2021)    . amLODipine (NORVASC) 10 MG tablet Take 1 tablet (10 mg total) by mouth daily. 90 tablet 0  . apixaban (ELIQUIS) 5 MG TABS tablet 1 tablet (5mg ) twice daily after completion of starter pack 60 tablet 1  . traZODone (DESYREL) 50 MG tablet Take 0.5-1 tablets (25-50 mg total) by mouth at bedtime as needed for sleep. (Patient not taking: Reported on 03/16/2021) 30 tablet 3   No facility-administered medications prior to visit.     ROS Review of Systems  Constitutional: Negative for activity change and appetite change.  HENT: Negative for sinus pressure and sore throat.   Eyes: Negative for visual disturbance.  Respiratory: Negative for cough, chest tightness and shortness of breath.   Cardiovascular: Negative for chest pain and leg swelling.  Gastrointestinal: Negative for abdominal distention, abdominal pain, constipation and diarrhea.  Endocrine: Negative.   Genitourinary: Negative for dysuria.  Musculoskeletal: Negative for joint swelling and myalgias.  Skin: Negative for rash.  Allergic/Immunologic: Negative.   Neurological: Positive for numbness. Negative for weakness and light-headedness.  Psychiatric/Behavioral: Negative for dysphoric mood and suicidal ideas.  Objective:  BP 116/75   Pulse 66   Ht 6\' 1"  (1.854 m)   Wt 192 lb 12.8 oz (87.5 kg)   SpO2 100%   BMI 25.44 kg/m   BP/Weight 03/16/2021 01/14/2021 9/56/2130  Systolic BP 865 784 696  Diastolic BP 75 86 83  Wt. (Lbs) 192.8 190 191  BMI 25.44 25.07 25.2      Physical Exam Constitutional:       Appearance: He is well-developed.  Neck:     Vascular: No JVD.  Cardiovascular:     Rate and Rhythm: Normal rate.     Heart sounds: Normal heart sounds. No murmur heard.   Pulmonary:     Effort: Pulmonary effort is normal.     Breath sounds: Normal breath sounds. No wheezing or rales.  Chest:     Chest wall: No tenderness.  Abdominal:     General: Bowel sounds are normal. There is no distension.     Palpations: Abdomen is soft. There is no mass.     Tenderness: There is no abdominal tenderness.  Musculoskeletal:        General: Normal range of motion.     Right lower leg: No edema.     Left lower leg: No edema.  Neurological:     Mental Status: He is alert and oriented to person, place, and time.  Psychiatric:        Mood and Affect: Mood normal.     CMP Latest Ref Rng & Units 01/20/2021 12/01/2020 11/05/2020  Glucose 65 - 99 mg/dL - 95 84  BUN 6 - 24 mg/dL - 8 13  Creatinine 0.61 - 1.24 mg/dL 1.10 0.97 0.94  Sodium 134 - 144 mmol/L - 139 135  Potassium 3.5 - 5.2 mmol/L - 4.4 3.6  Chloride 96 - 106 mmol/L - 101 104  CO2 20 - 29 mmol/L - 22 22  Calcium 8.7 - 10.2 mg/dL - 9.7 8.8(L)  Total Protein 6.0 - 8.5 g/dL - 7.9 7.0  Total Bilirubin 0.0 - 1.2 mg/dL - 0.3 1.0  Alkaline Phos 44 - 121 IU/L - 66 57  AST 0 - 40 IU/L - 21 17  ALT 0 - 44 IU/L - 13 14    Lipid Panel  No results found for: CHOL, TRIG, HDL, CHOLHDL, VLDL, LDLCALC, LDLDIRECT  CBC    Component Value Date/Time   WBC 6.0 12/01/2020 1010   WBC 6.6 11/06/2020 0255   RBC 4.33 12/01/2020 1010   RBC 3.98 (L) 11/06/2020 0255   HGB 14.9 12/01/2020 1010   HCT 44.1 12/01/2020 1010   PLT 184 12/01/2020 1010   MCV 102 (H) 12/01/2020 1010   MCH 34.4 (H) 12/01/2020 1010   MCH 34.9 (H) 11/06/2020 0255   MCHC 33.8 12/01/2020 1010   MCHC 34.2 11/06/2020 0255   RDW 12.0 12/01/2020 1010   LYMPHSABS 1.7 12/01/2020 1010   EOSABS 0.1 12/01/2020 1010   BASOSABS 0.0 12/01/2020 1010    No results found for:  HGBA1C  Assessment & Plan:  1. Paresthesia Advised to work on quitting alcohol Will send a vitamin B12 level Initiate gabapentin-sedating side effects have been discussed - Vitamin B12 - gabapentin (NEURONTIN) 300 MG capsule; Take 1 capsule (300 mg total) by mouth at bedtime.  Dispense: 30 capsule; Refill: 3  2. Thyroid nodule Will refer as he  needs a biopsy - Ambulatory referral to Endocrinology  3. Essential hypertension Controlled Counseled on blood pressure goal of less than 130/80, low-sodium, DASH diet,  medication compliance, 150 minutes of moderate intensity exercise per week. Discussed medication compliance, adverse effects. - amLODipine (NORVASC) 10 MG tablet; Take 1 tablet (10 mg total) by mouth daily.  Dispense: 90 tablet; Refill: 1  4. Screening for diabetes mellitus (DM) We will screen for diabetes mellitus due to complaints of paresthesia - HgB A1c  5. 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 amLODipine (NORVASC) 10 MG tablet    Sig: Take 1 tablet (10 mg total) by mouth daily.    Dispense:  90 tablet    Refill:  1  . gabapentin (NEURONTIN) 300 MG capsule    Sig: Take 1 capsule (300 mg total) by mouth at bedtime.    Dispense:  30 capsule    Refill:  3    Follow-up: Return in about 3 months (around 06/16/2021) for coordination of care.       Charlott Rakes, MD, FAAFP. [facility] and [place], [place]   03/16/2021, 11:00 AM

## 2021-03-17 LAB — VITAMIN B12: Vitamin B-12: 286 pg/mL (ref 232–1245)

## 2021-03-18 ENCOUNTER — Telehealth: Payer: Self-pay

## 2021-03-18 NOTE — Telephone Encounter (Signed)
Pt was called and a VM was left informing patient of lab results.  Normal results letter will be mailed to patient.

## 2021-03-18 NOTE — Telephone Encounter (Signed)
-----   Message from Charlott Rakes, MD sent at 03/17/2021 11:18 AM EDT ----- Please inform the patient that labs are normal. Thank you.

## 2021-03-23 ENCOUNTER — Other Ambulatory Visit: Payer: Self-pay | Admitting: Thoracic Surgery (Cardiothoracic Vascular Surgery)

## 2021-03-23 DIAGNOSIS — J9859 Other diseases of mediastinum, not elsewhere classified: Secondary | ICD-10-CM

## 2021-03-24 LAB — HCG, TOTAL, QUANTITATIVE: hCG, Beta Chain, Quant, S: <3 m[IU]/mL (ref <5)

## 2021-03-24 LAB — AFP TUMOR MARKER: AFP-Tumor Marker: 3.4 ng/mL (ref <6.1)

## 2021-03-24 LAB — LACTATE DEHYDROGENASE: LDH: 159 U/L (ref 120–250)

## 2021-03-25 ENCOUNTER — Telehealth: Payer: Medicare Other | Admitting: Thoracic Surgery (Cardiothoracic Vascular Surgery)

## 2021-03-25 ENCOUNTER — Other Ambulatory Visit: Payer: Self-pay

## 2021-04-04 ENCOUNTER — Other Ambulatory Visit: Payer: Self-pay

## 2021-04-04 ENCOUNTER — Encounter: Payer: Self-pay | Admitting: Endocrinology

## 2021-04-04 ENCOUNTER — Telehealth: Payer: Medicare Other | Admitting: Thoracic Surgery (Cardiothoracic Vascular Surgery)

## 2021-04-04 NOTE — Progress Notes (Signed)
     [address]       [place] [address]             [phone]       Pt did not answer his phone.  Message left  Marissa Weaver Bary Leriche

## 2021-05-09 ENCOUNTER — Other Ambulatory Visit: Payer: Self-pay

## 2021-06-23 ENCOUNTER — Encounter: Payer: Self-pay | Admitting: Family Medicine

## 2021-06-23 ENCOUNTER — Ambulatory Visit: Payer: Medicare Other | Attending: Family Medicine | Admitting: Family Medicine

## 2021-06-23 ENCOUNTER — Other Ambulatory Visit: Payer: Self-pay

## 2021-06-23 VITALS — BP 120/74 | HR 74 | Ht 73.0 in | Wt 182.0 lb

## 2021-06-23 DIAGNOSIS — J9859 Other diseases of mediastinum, not elsewhere classified: Secondary | ICD-10-CM | POA: Diagnosis not present

## 2021-06-23 DIAGNOSIS — Z86718 Personal history of other venous thrombosis and embolism: Secondary | ICD-10-CM | POA: Insufficient documentation

## 2021-06-23 DIAGNOSIS — Z7182 Exercise counseling: Secondary | ICD-10-CM | POA: Insufficient documentation

## 2021-06-23 DIAGNOSIS — E041 Nontoxic single thyroid nodule: Secondary | ICD-10-CM | POA: Diagnosis not present

## 2021-06-23 DIAGNOSIS — R222 Localized swelling, mass and lump, trunk: Secondary | ICD-10-CM | POA: Insufficient documentation

## 2021-06-23 DIAGNOSIS — I10 Essential (primary) hypertension: Secondary | ICD-10-CM | POA: Insufficient documentation

## 2021-06-23 DIAGNOSIS — Z713 Dietary counseling and surveillance: Secondary | ICD-10-CM | POA: Insufficient documentation

## 2021-06-23 DIAGNOSIS — Z1211 Encounter for screening for malignant neoplasm of colon: Secondary | ICD-10-CM | POA: Diagnosis not present

## 2021-06-23 DIAGNOSIS — Z79899 Other long term (current) drug therapy: Secondary | ICD-10-CM | POA: Diagnosis not present

## 2021-06-23 DIAGNOSIS — Z7901 Long term (current) use of anticoagulants: Secondary | ICD-10-CM | POA: Insufficient documentation

## 2021-06-23 NOTE — Progress Notes (Signed)
Subjective:  Patient ID: Alex Coffin Sr., male    DOB: 1966/10/16  Age: 55 y.o. MRN: [id number]  CC: Hypertension   HPI Alex HEMSWORTH Sr. is a 55 y.o. year old male with a history of hypertension, PE, DVT  in 10/2020 for acute PE and left lower extremity DVT (completed anticoagulation with Eliquis), thyroid nodule, mediastinal mass.  Interval History: He should have had a telehealth visit with cardiothoracic surgery on 04/04/2021 but notes reveal he did not answer his phone. He is also yet to undergo thyroid biopsy and notes from the referral I placed indicate endocrine had attempted to reach him but failed.  Paresthesia has improved per patient and he has no need for the Gabapentin. Compliant with his antihypertensive.  Past Medical History:  Diagnosis Date   Kidney stone     No past surgical history on file.  No family history on file.  No Known Allergies  Outpatient Medications Prior to Visit  Medication Sig Dispense Refill   gabapentin (NEURONTIN) 300 MG capsule Take 1 capsule (300 mg total) by mouth at bedtime. 30 capsule 3   amLODipine (NORVASC) 10 MG tablet Take 1 tablet (10 mg total) by mouth daily. 90 tablet 1   ibuprofen (ADVIL) 200 MG tablet Take 400 mg by mouth every 6 (six) hours as needed for mild pain (or headaches). (Patient not taking: No sig reported)     Multiple Vitamins-Minerals (ONE-A-DAY MENS HEALTH FORMULA) TABS Take 1 tablet by mouth daily with breakfast. (Patient not taking: No sig reported)     No facility-administered medications prior to visit.     ROS Review of Systems  Constitutional:  Negative for activity change and appetite change.  HENT:  Negative for sinus pressure and sore throat.   Eyes:  Negative for visual disturbance.  Respiratory:  Negative for cough, chest tightness and shortness of breath.   Cardiovascular:  Negative for chest pain and leg swelling.  Gastrointestinal:  Negative for abdominal distention, abdominal pain,  constipation and diarrhea.  Endocrine: Negative.   Genitourinary:  Negative for dysuria.  Musculoskeletal:  Negative for joint swelling and myalgias.  Skin:  Negative for rash.  Allergic/Immunologic: Negative.   Neurological:  Negative for weakness, light-headedness and numbness.  Psychiatric/Behavioral:  Negative for dysphoric mood and suicidal ideas.    Objective:  BP 120/74   Pulse 74   Ht '6\' 1"'$  (1.854 m)   Wt 182 lb (82.6 kg)   SpO2 99%   BMI 24.01 kg/m   BP/Weight 06/23/2021 [id number]  Systolic BP 123456 [id number]  Diastolic BP 74 75 86  Wt. (Lbs) 182 192.8 190  BMI 24.01 25.44 25.07      Physical Exam Constitutional:      Appearance: He is well-developed.  Cardiovascular:     Rate and Rhythm: Normal rate.     Heart sounds: Normal heart sounds. No murmur heard. Pulmonary:     Effort: Pulmonary effort is normal.     Breath sounds: Normal breath sounds. No wheezing or rales.  Chest:     Chest wall: No tenderness.  Abdominal:     General: Bowel sounds are normal. There is no distension.     Palpations: Abdomen is soft. There is no mass.     Tenderness: There is no abdominal tenderness.  Musculoskeletal:        General: Normal range of motion.     Right lower leg: No edema.     Left lower leg: No edema.  Neurological:     Mental Status: He is alert and oriented to person, place, and time.  Psychiatric:        Mood and Affect: Mood normal.    CMP Latest Ref Rng & Units 01/20/2021 12/01/2020 11/05/2020  Glucose 65 - 99 mg/dL - 95 84  BUN 6 - 24 mg/dL - 8 13  Creatinine 0.61 - 1.24 mg/dL 1.10 0.97 0.94  Sodium 134 - 144 mmol/L - 139 135  Potassium 3.5 - 5.2 mmol/L - 4.4 3.6  Chloride 96 - 106 mmol/L - 101 104  CO2 20 - 29 mmol/L - 22 22  Calcium 8.7 - 10.2 mg/dL - 9.7 8.8(L)  Total Protein 6.0 - 8.5 g/dL - 7.9 7.0  Total Bilirubin 0.0 - 1.2 mg/dL - 0.3 1.0  Alkaline Phos 44 - 121 IU/L - 66 57  AST 0 - 40 IU/L - 21 17  ALT 0 - 44 IU/L - 13 14    Lipid  Panel  No results found for: CHOL, TRIG, HDL, CHOLHDL, VLDL, LDLCALC, LDLDIRECT  CBC    Component Value Date/Time   WBC 6.0 12/01/2020 1010   WBC 6.6 11/06/2020 0255   RBC 4.33 12/01/2020 1010   RBC 3.98 (L) 11/06/2020 0255   HGB 14.9 12/01/2020 1010   HCT 44.1 12/01/2020 1010   PLT 184 12/01/2020 1010   MCV 102 (H) 12/01/2020 1010   MCH 34.4 (H) 12/01/2020 1010   MCH 34.9 (H) 11/06/2020 0255   MCHC 33.8 12/01/2020 1010   MCHC 34.2 11/06/2020 0255   RDW 12.0 12/01/2020 1010   LYMPHSABS 1.7 12/01/2020 1010   EOSABS 0.1 12/01/2020 1010   BASOSABS 0.0 12/01/2020 1010    Lab Results  Component Value Date   HGBA1C 4.9 03/16/2021    Assessment & Plan:  1. Essential hypertension Controlled Continue Amlodipine Counseled on blood pressure goal of less than 130/80, low-sodium, DASH diet, medication compliance, 150 minutes of moderate intensity exercise per week. Discussed medication compliance, adverse effects.  2. Thyroid nodule Referred for thyroid biopsy previously and Clinic unable to contact him He has been provided with the number to [place] so this can be rescheduled.  3. Mediastinal mass Needs to follow up with Cardiothoracic Surgery He will be calling Dr Sunday Corn foot to schedule this  4. Screening for colon cancer - Cologuard    No orders of the defined types were placed in this encounter.   Follow-up: Return in about 3 months (around 09/22/2021) for Follow-up on thyroid nodule and mediastinal mass.       Charlott Rakes, MD, FAAFP. [facility] and [facility], [place]   06/23/2021, 1:18 PM

## 2021-06-23 NOTE — Patient Instructions (Signed)
[phone]  Dr Quin Hoop

## 2021-07-12 LAB — COLOGUARD: Cologuard: NEGATIVE

## 2021-07-20 ENCOUNTER — Other Ambulatory Visit: Payer: Self-pay

## 2021-09-19 ENCOUNTER — Ambulatory Visit: Payer: Medicare Other | Admitting: Family Medicine

## 2021-09-28 ENCOUNTER — Other Ambulatory Visit: Payer: Self-pay

## 2021-09-29 ENCOUNTER — Other Ambulatory Visit: Payer: Self-pay

## 2021-11-28 ENCOUNTER — Ambulatory Visit: Payer: Medicare Other | Admitting: Family Medicine

## 2022-04-04 ENCOUNTER — Telehealth: Payer: Self-pay

## 2022-04-04 NOTE — Telephone Encounter (Signed)
LVM for Patient to return call  RE: Schedule AWV with [facility] with Jessy Cybulski CMA  Please transfer patient when if they return the call  

## 2022-04-05 IMAGING — DX DG CHEST 2V
2 series · 2 of 2 positions shown · non-contrast
Comparison: No prior.

CLINICAL DATA: Near syncope.  Chest pain.

EXAM:
CHEST - 2 VIEW

[chest pa]
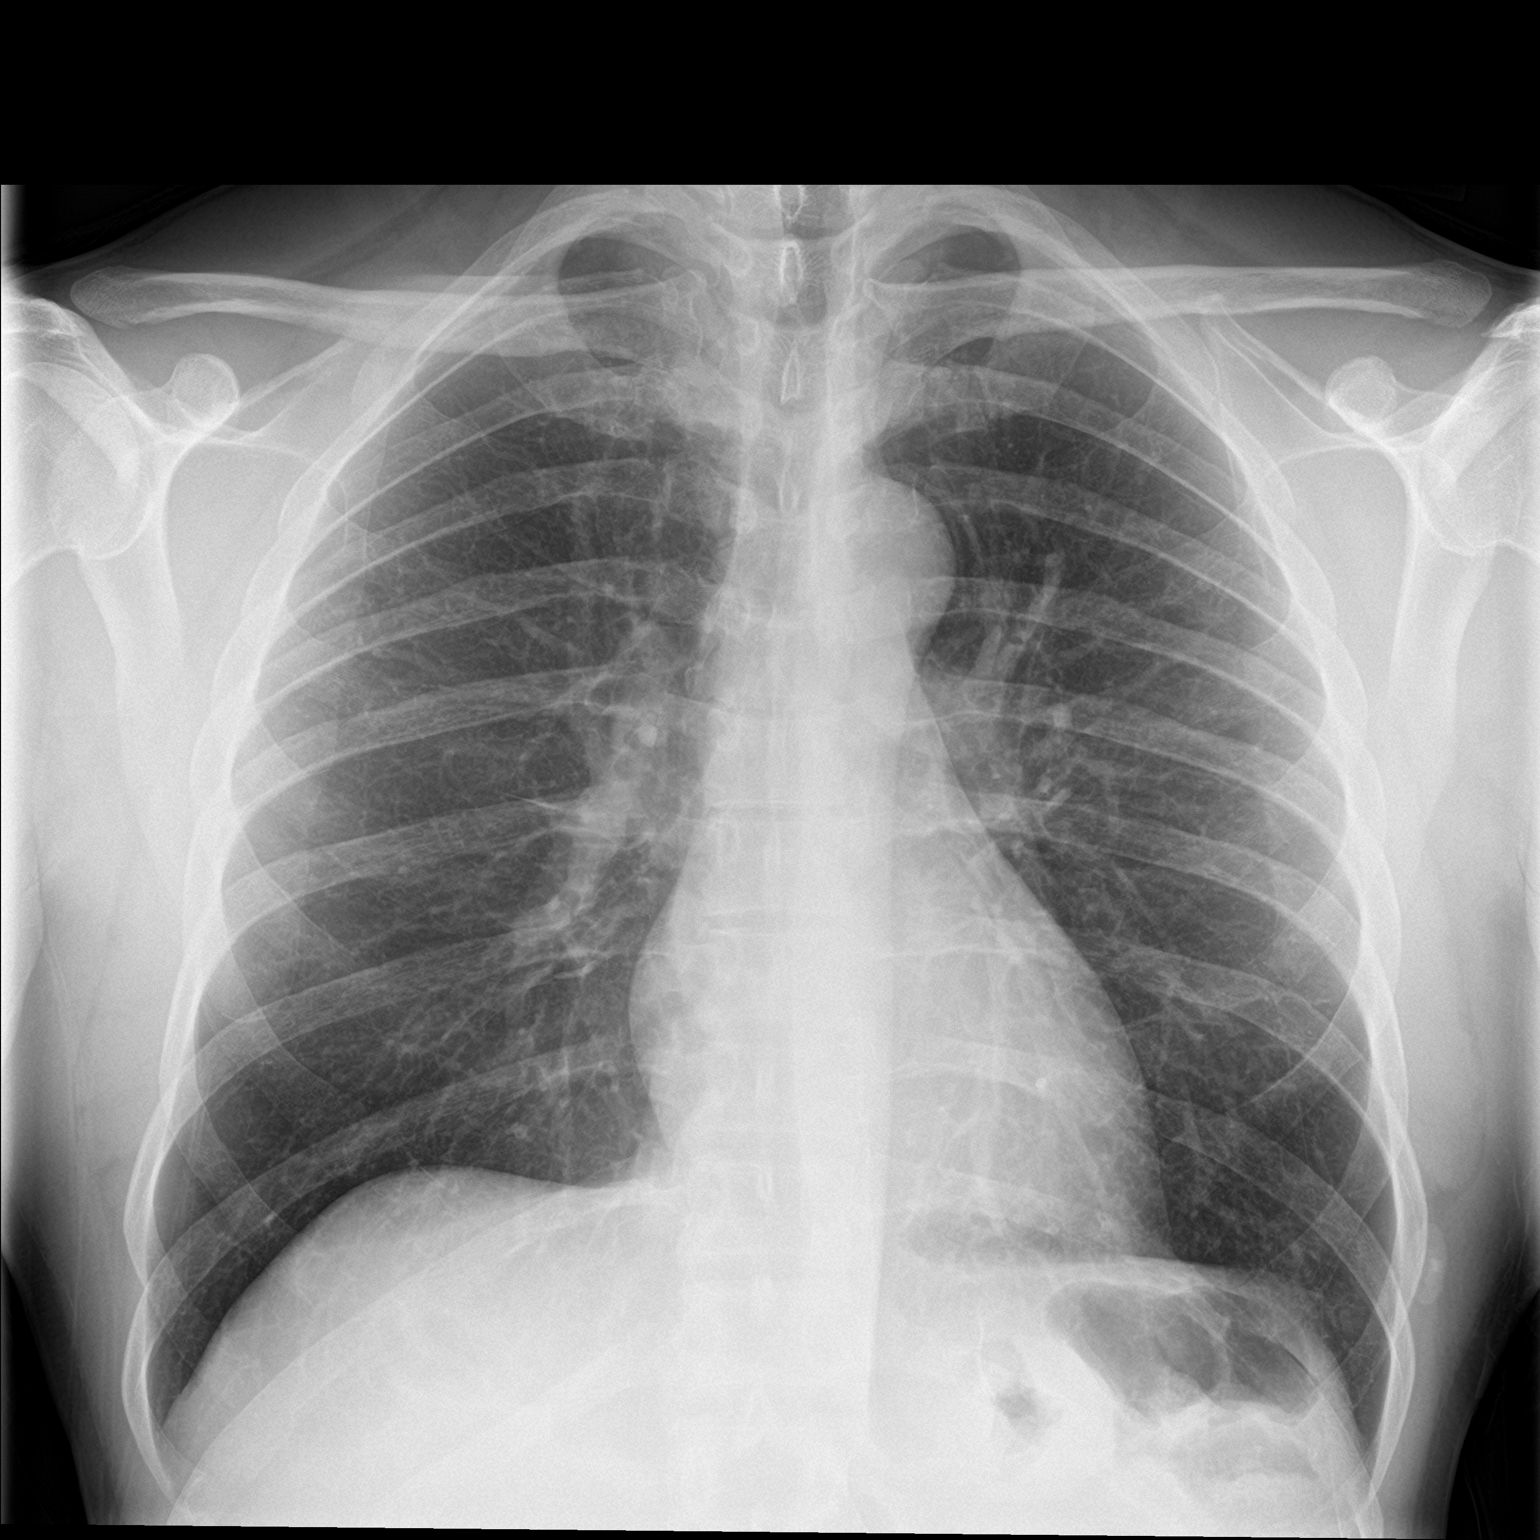

[chest lat]
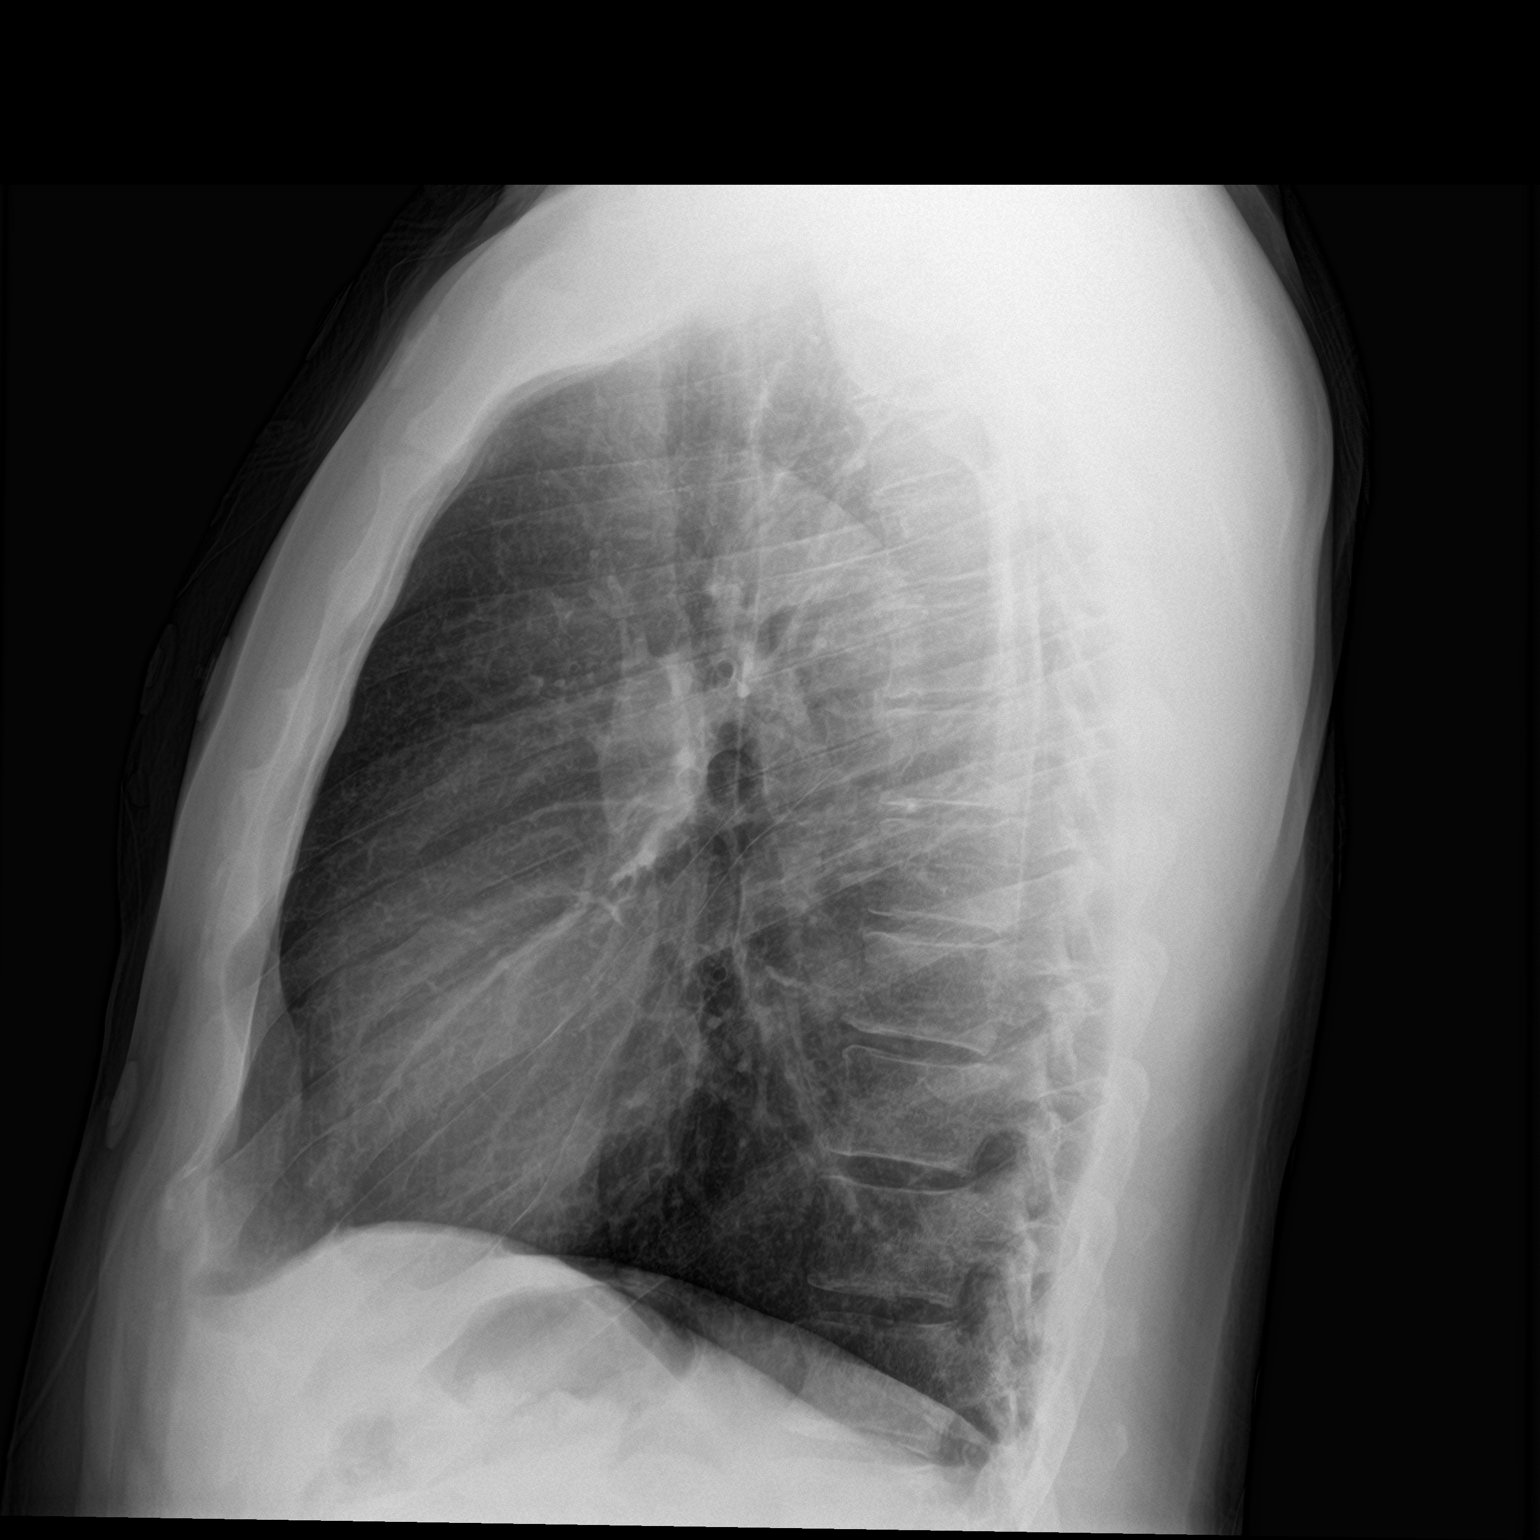

[2 of 2 positions shown; findings below may reference images not displayed]

FINDINGS: Mediastinum hilar structures normal. Mild right perihilar and left
base subsegmental atelectasis and or scarring. Lungs are clear of
acute infiltrates. No pleural effusion or pneumothorax. Heart size
normal. No acute bony abnormality.
IMPRESSION: Mild right perihilar and left base subsegmental atelectasis and or
scarring. No acute infiltrate.

## 2022-04-05 IMAGING — CT CT ANGIO CHEST
2 of 6 series · 18 of 36 positions shown · IV contrast (omnipaque)
Comparison: None.

CLINICAL DATA: Shortness of breath. High probability for acute
pulmonary embolus.

EXAM:
CT ANGIOGRAPHY CHEST WITH CONTRAST
TECHNIQUE: Multidetector CT imaging of the chest was performed using the
standard protocol during bolus administration of intravenous
contrast. Multiplanar CT image reconstructions and MIPs were
obtained to evaluate the vascular anatomy.
CONTRAST:  50mL OMNIPAQUE IOHEXOL 350 MG/ML SOLN

[Series 7: pe thins · axial · 0.93mm/px · z∈[+1184,+1486]mm · 17 of 482 slices shown]
[im 25/482  lung]
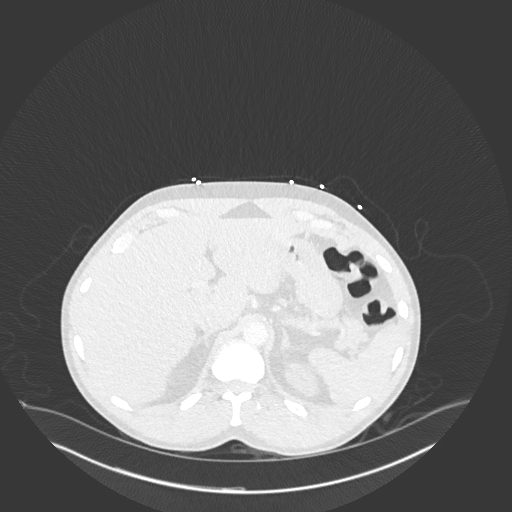
[im 49/482  mediastinal]
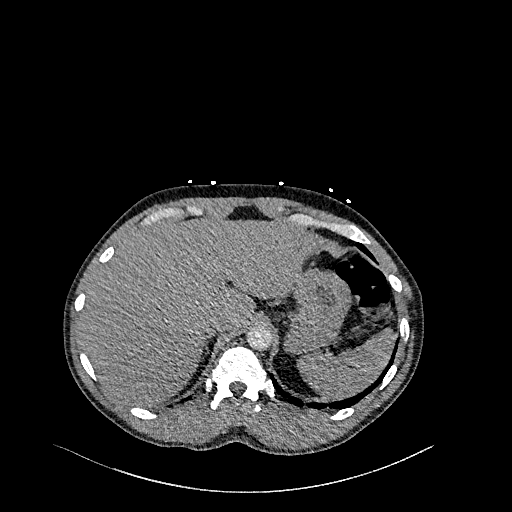
[im 73/482  lung]
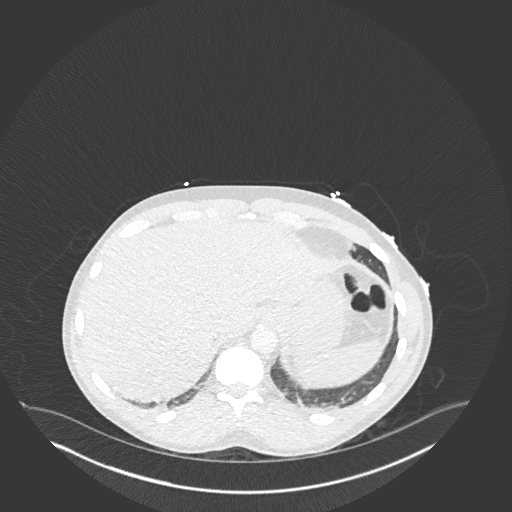
[im 97/482  mediastinal]
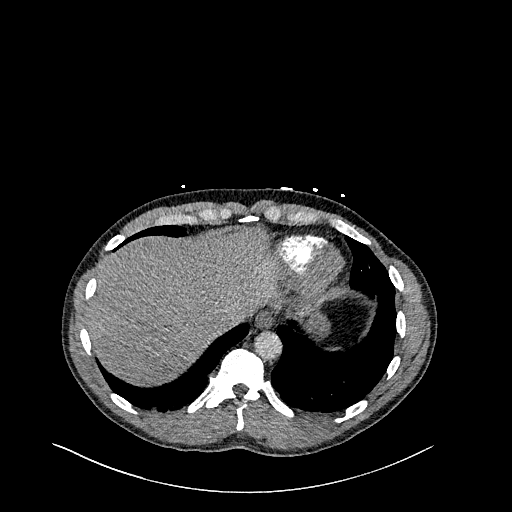
[im 145/482  lung]
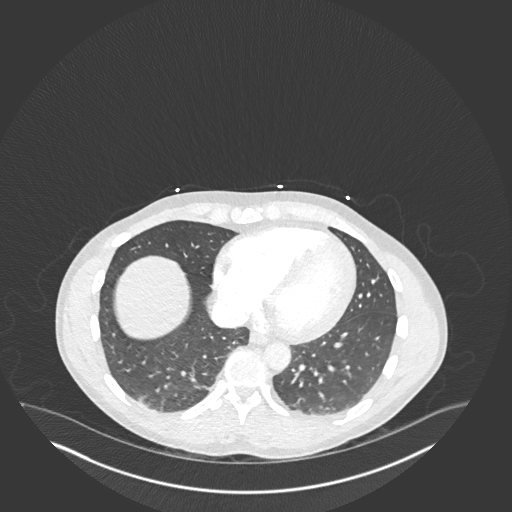
[im 169/482  mediastinal]
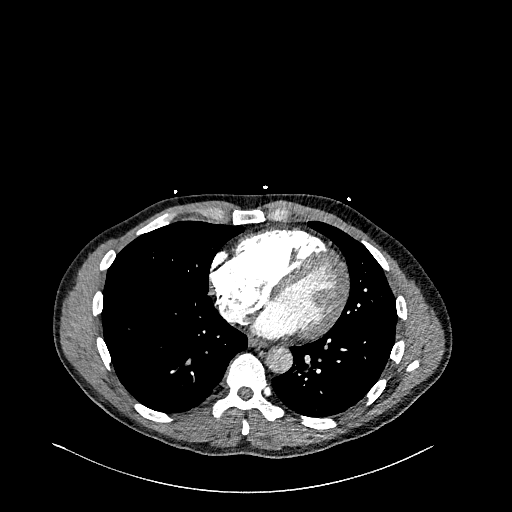
[im 193/482  lung]
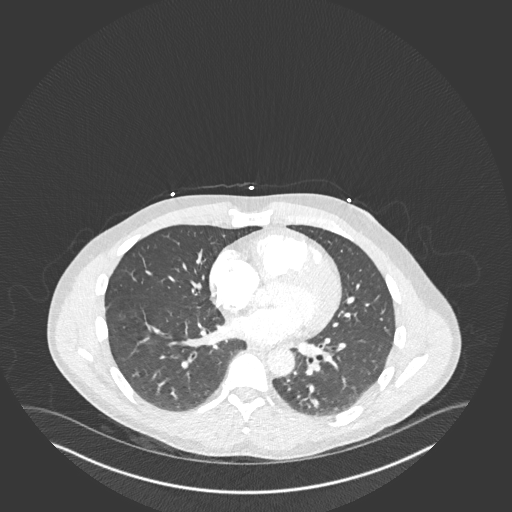
[im 217/482  mediastinal]
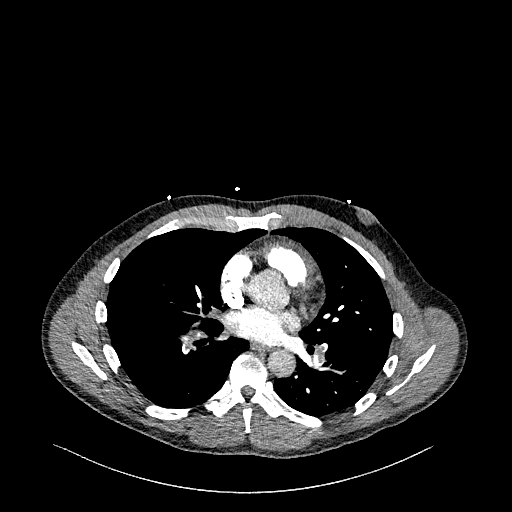
[im 241/482  lung]
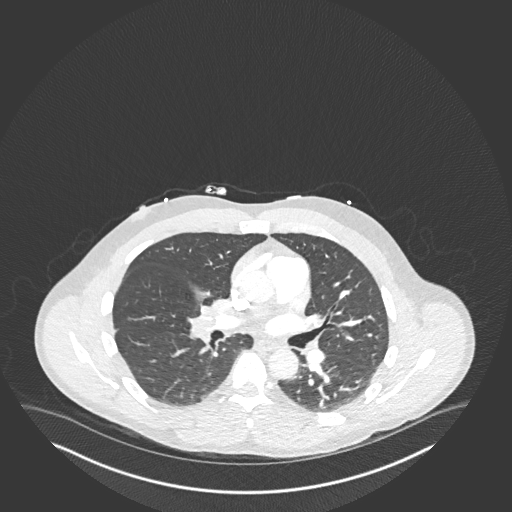
[im 265/482  mediastinal]
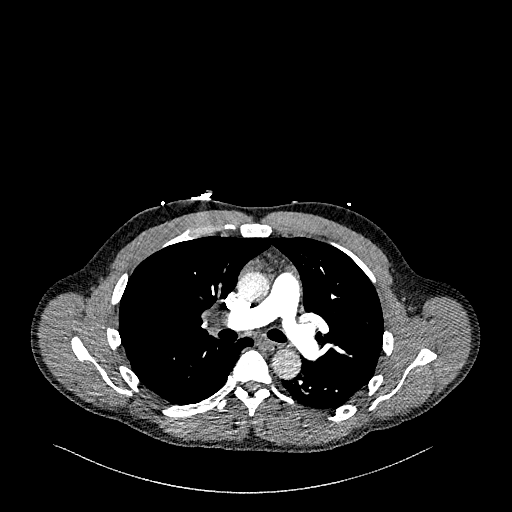
[im 289/482  lung]
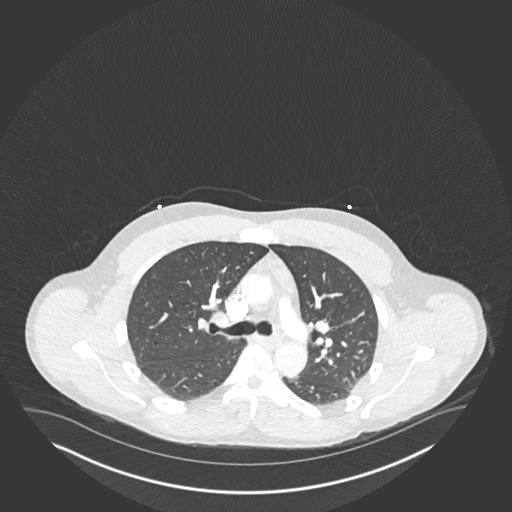
[im 313/482  mediastinal]
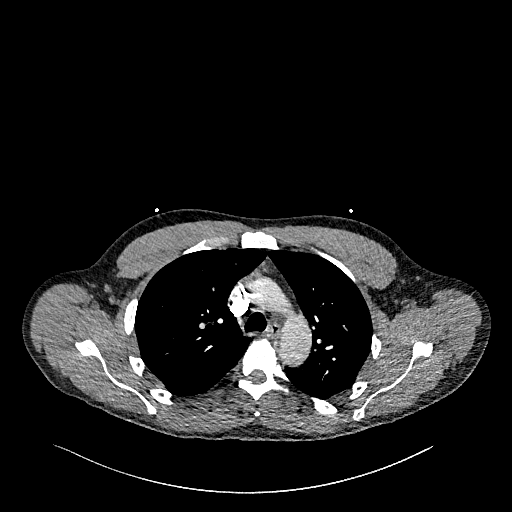
[im 337/482  lung]
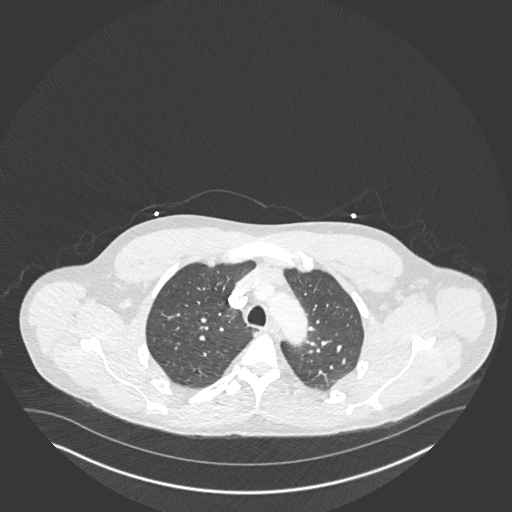
[im 385/482  mediastinal]
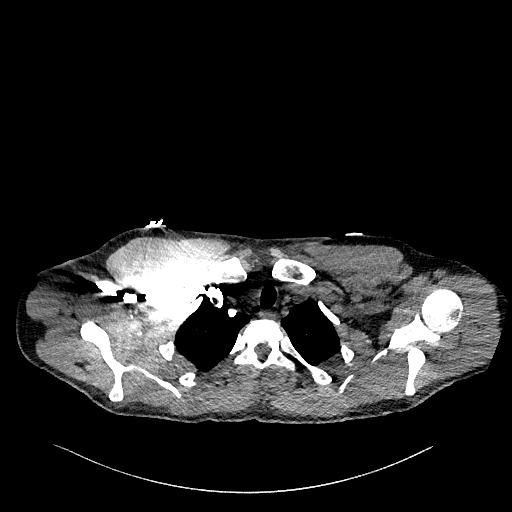
[im 409/482  lung]
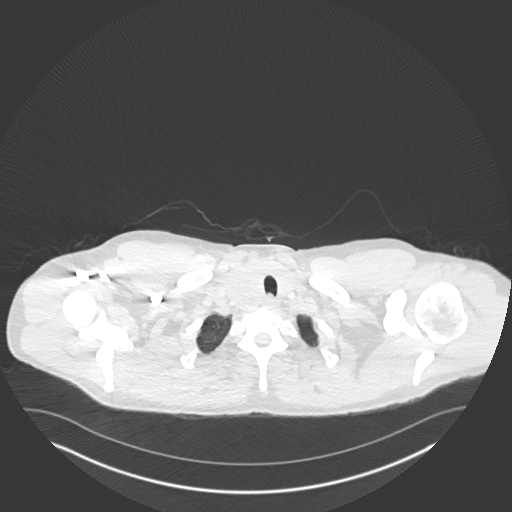
[im 433/482  mediastinal]
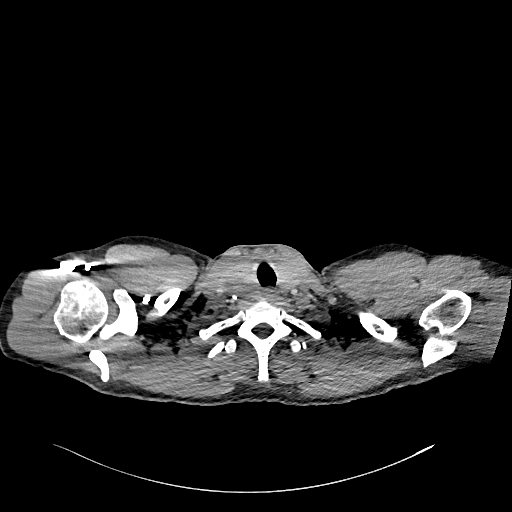
[im 457/482  lung]
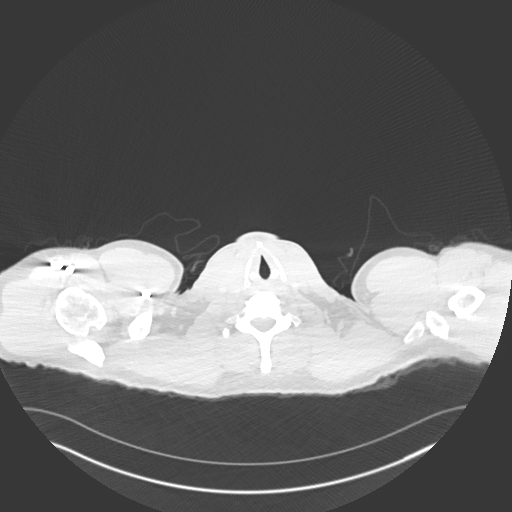

[Series 8: pe 2mm cor · coronal · 0.66mm/px · 1 of 151 slices shown]
[im 76/151  mediastinal]
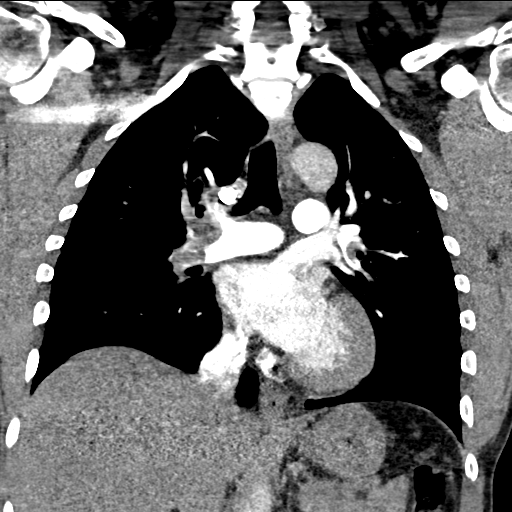

[18 of 36 positions shown; findings below may reference images not displayed]

FINDINGS: Cardiovascular: The heart size appears normal. No pericardial
effusion.

Large central obstructing pulmonary embolus is identified within the
distal right main pulmonary artery extending into the right lower
lobar and segmental pulmonary arteries. There also multiple clots
extending into the segmental branches of the left upper lobe,
lingula and left lower lobe. Signs of right heart strain is
identified with RV to LV ratio of 1.19.

Mediastinum/Nodes: Large low-attenuation nodule within the right
lobe of thyroid gland has a maximum dimension of 3.7 cm. Increased
nodular soft tissue within the anterior mediastinum is identified
within the thyroid bed, image 69/5. No axillary or supraclavicular
adenopathy. No mediastinal or hilar adenopathy.

Lungs/Pleura: No pleural effusion. Subsegmental atelectasis is
identified within both lung bases. No pleural effusion, airspace
consolidation or pneumothorax. No interstitial edema.

Upper Abdomen: No acute abnormality.

Musculoskeletal: No chest wall abnormality. No acute or significant
osseous findings.

Review of the MIP images confirms the above findings.
IMPRESSION: 1. Examination is positive for acute pulmonary embolus with CT
evidence of right heart strain (RV/LV Ratio 1.19) consistent with at
least submassive (intermediate risk) PE. The presence of right heart
strain has been associated with an increased risk of morbidity and
mortality.
2. Large low-attenuation nodule within the right lobe of thyroid
gland. Recommend thyroid US (ref: [HOSPITAL]. [DATE]):
3. Increased nodular soft tissue within the anterior mediastinum.
Primary differential considerations include thymic nodular or
lymphoid hyperplasia versus benign or malignant neoplasm. Recommend
referral to thoracic surgery for further management.
4. Critical Value/emergent results were called by telephone at the
time of interpretation on 11/04/2020 at [DATE] to provider Samsonaite
Dany, who verbally acknowledged these results.

## 2022-04-06 ENCOUNTER — Other Ambulatory Visit: Payer: Self-pay | Admitting: Family Medicine

## 2022-04-06 ENCOUNTER — Other Ambulatory Visit: Payer: Self-pay

## 2022-04-06 DIAGNOSIS — I10 Essential (primary) hypertension: Secondary | ICD-10-CM

## 2022-04-06 MED ORDER — AMLODIPINE BESYLATE 10 MG PO TABS
10.0000 mg | ORAL_TABLET | Freq: Every day | ORAL | 0 refills | Status: DC
  Filled 2022-04-06: qty 30, 30d supply, fill #0

## 2022-04-26 ENCOUNTER — Ambulatory Visit (HOSPITAL_BASED_OUTPATIENT_CLINIC_OR_DEPARTMENT_OTHER): Payer: Medicare Other | Admitting: Family Medicine

## 2022-04-26 ENCOUNTER — Telehealth: Payer: Self-pay

## 2022-04-26 ENCOUNTER — Encounter: Payer: Self-pay | Admitting: Family Medicine

## 2022-04-26 ENCOUNTER — Ambulatory Visit (HOSPITAL_COMMUNITY)
Admission: RE | Admit: 2022-04-26 | Discharge: 2022-04-26 | Disposition: A | Discharge status: Home / Self Care | Payer: Medicare Other | Source: Ambulatory Visit | Attending: Family Medicine | Admitting: Family Medicine

## 2022-04-26 ENCOUNTER — Other Ambulatory Visit: Payer: Self-pay

## 2022-04-26 VITALS — BP 124/79 | HR 67 | Temp 98.0°F | Ht 73.0 in | Wt 185.2 lb

## 2022-04-26 DIAGNOSIS — M79604 Pain in right leg: Secondary | ICD-10-CM | POA: Insufficient documentation

## 2022-04-26 DIAGNOSIS — R202 Paresthesia of skin: Secondary | ICD-10-CM

## 2022-04-26 DIAGNOSIS — J9859 Other diseases of mediastinum, not elsewhere classified: Secondary | ICD-10-CM

## 2022-04-26 DIAGNOSIS — D152 Benign neoplasm of mediastinum: Secondary | ICD-10-CM | POA: Insufficient documentation

## 2022-04-26 DIAGNOSIS — Z1159 Encounter for screening for other viral diseases: Secondary | ICD-10-CM | POA: Insufficient documentation

## 2022-04-26 DIAGNOSIS — M7989 Other specified soft tissue disorders: Secondary | ICD-10-CM | POA: Insufficient documentation

## 2022-04-26 DIAGNOSIS — I82599 Chronic embolism and thrombosis of other specified deep vein of unspecified lower extremity: Secondary | ICD-10-CM

## 2022-04-26 DIAGNOSIS — R040 Epistaxis: Secondary | ICD-10-CM | POA: Insufficient documentation

## 2022-04-26 DIAGNOSIS — R262 Difficulty in walking, not elsewhere classified: Secondary | ICD-10-CM | POA: Insufficient documentation

## 2022-04-26 DIAGNOSIS — I82592 Chronic embolism and thrombosis of other specified deep vein of left lower extremity: Secondary | ICD-10-CM | POA: Insufficient documentation

## 2022-04-26 DIAGNOSIS — I10 Essential (primary) hypertension: Secondary | ICD-10-CM | POA: Insufficient documentation

## 2022-04-26 DIAGNOSIS — Z7901 Long term (current) use of anticoagulants: Secondary | ICD-10-CM | POA: Insufficient documentation

## 2022-04-26 MED ORDER — APIXABAN 5 MG PO TABS
5.0000 mg | ORAL_TABLET | Freq: Two times a day (BID) | ORAL | 2 refills | Status: DC
  Filled 2022-04-26: qty 60, 30d supply, fill #0

## 2022-04-26 MED ORDER — APIXABAN 5 MG PO TABS
ORAL_TABLET | ORAL | 0 refills | Status: DC
  Filled 2022-04-26: qty 74, 30d supply, fill #0

## 2022-04-26 MED ORDER — AMLODIPINE BESYLATE 10 MG PO TABS
10.0000 mg | ORAL_TABLET | Freq: Every day | ORAL | 1 refills | Status: DC
  Filled 2022-04-26 (×2): qty 90, 90d supply, fill #0
  Filled 2022-09-19: qty 90, 90d supply, fill #1

## 2022-04-26 NOTE — Telephone Encounter (Signed)
Velva Harman with [facility] reports pt.'s study positive for DVT. Elmo Putt in the practice notified Dr. Margarita Rana who states they can let pt. Leave the department.

## 2022-04-26 NOTE — Progress Notes (Addendum)
Subjective:  Patient ID: Alex Coffin Sr., male    DOB: March 24, 1966  Age: 56 y.o. MRN: [id number]  CC: Hypertension   HPI Alex DUNKLEE Sr. is a 56 y.o. year old male with a history of hypertension, PE, DVT  in 10/2020 for acute PE and left lower extremity DVT (completed anticoagulation with Eliquis), thyroid nodule, mediastinal mass.  Interval History: 6 weeks ago he developed right lower extremity  swelling followed by pain and then difficulty walking, pressure and feeling like his right lower extremity   was about to explode. He complains of burning in his legs and numbness in his toes which is worse with walking and he does have some swelling with prolonged standing but pain has subsided  In 03/2021 he had complained of needles in his legs. He drinks alcohol socially, he has no history of diabetes with last A1c of 4.6 in 03/2021.  7 months ago he noticed bleeding in the form of epistaxis, hematochezia, hematuria and this lasted about 2 months and was an everyday thing. On one occasion he though he needed to spoil the air but then had rectal bleed. Epistaxis was up to 112mls, watery but this occurred twice. He was not taking any medications beside his antihypertensives at the time. He did have a splinter in his finger on 1 occasion and noticed that bleeding did not stop after prolonged period of time. Past Medical History:  Diagnosis Date   Kidney stone     History reviewed. No pertinent surgical history.  History reviewed. No pertinent family history.  Social History   Socioeconomic History   Marital status: Single    Spouse name: Not on file   Number of children: Not on file   Years of education: Not on file   Highest education level: Not on file  Occupational History   Not on file  Tobacco Use   Smoking status: Some Days   Smokeless tobacco: Never  Substance and Sexual Activity   Alcohol use: No   Drug use: No   Sexual activity: Not on file  Other Topics Concern    Not on file  Social History Narrative   Not on file   Social Determinants of Health   Financial Resource Strain: Not on file  Food Insecurity: Not on file  Transportation Needs: Not on file  Physical Activity: Not on file  Stress: Not on file  Social Connections: Not on file    No Known Allergies  Outpatient Medications Prior to Visit  Medication Sig Dispense Refill   ibuprofen (ADVIL) 200 MG tablet Take 400 mg by mouth every 6 (six) hours as needed for mild pain (or headaches).     Multiple Vitamins-Minerals (ONE-A-DAY MENS HEALTH FORMULA) TABS Take 1 tablet by mouth daily with breakfast.     amLODipine (NORVASC) 10 MG tablet Take 1 tablet (10 mg total) by mouth once daily. 30 tablet 0   No facility-administered medications prior to visit.     ROS Review of Systems  Constitutional:  Negative for activity change and appetite change.  HENT:  Negative for sinus pressure and sore throat.   Respiratory:  Negative for chest tightness, shortness of breath and wheezing.   Cardiovascular:  Negative for chest pain and palpitations.  Gastrointestinal:  Negative for abdominal distention, abdominal pain and constipation.  Genitourinary: Negative.   Musculoskeletal:        See HPI  Psychiatric/Behavioral:  Negative for behavioral problems and dysphoric mood.     Objective:  BP 124/79   Pulse 67   Temp 98 F (36.7 C) (Oral)   Ht 6\' 1"  (1.854 m)   Wt 185 lb 3.2 oz (84 kg)   SpO2 100%   BMI 24.43 kg/m      04/26/2022   11:09 AM 06/23/2021    9:00 AM 03/16/2021    9:25 AM  BP/Weight  Systolic BP 124 120 116  Diastolic BP 79 74 75  Wt. (Lbs) 185.2 182 192.8  BMI 24.43 kg/m2 24.01 kg/m2 25.44 kg/m2      Physical Exam Constitutional:      Appearance: He is well-developed.  Cardiovascular:     Rate and Rhythm: Normal rate.     Heart sounds: Normal heart sounds. No murmur heard. Pulmonary:     Effort: Pulmonary effort is normal.     Breath sounds: Normal breath sounds.  No wheezing or rales.  Chest:     Chest wall: No tenderness.  Abdominal:     General: Bowel sounds are normal. There is no distension.     Palpations: Abdomen is soft. There is no mass.     Tenderness: There is no abdominal tenderness.  Musculoskeletal:        General: Normal range of motion.     Right lower leg: Edema (trace edema of ankle) present.     Left lower leg: No edema.     Comments: He does have a prominent right great saphenous vein Negative Homans' sign.   Neurological:     Mental Status: He is alert and oriented to person, place, and time.  Psychiatric:        Mood and Affect: Mood normal.        Latest Ref Rng & Units 01/20/2021    2:52 PM 12/01/2020   10:10 AM 11/05/2020    2:36 AM  CMP  Glucose 65 - 99 mg/dL  95  84   BUN 6 - 24 mg/dL  8  13   Creatinine 11/07/2020 - 1.24 mg/dL 4.89  6.24  2.78   Sodium 134 - 144 mmol/L  139  135   Potassium 3.5 - 5.2 mmol/L  4.4  3.6   Chloride 96 - 106 mmol/L  101  104   CO2 20 - 29 mmol/L  22  22   Calcium 8.7 - 10.2 mg/dL  9.7  8.8   Total Protein 6.0 - 8.5 g/dL  7.9  7.0   Total Bilirubin 0.0 - 1.2 mg/dL  0.3  1.0   Alkaline Phos 44 - 121 IU/L  66  57   AST 0 - 40 IU/L  21  17   ALT 0 - 44 IU/L  13  14     Lipid Panel  No results found for: "CHOL", "TRIG", "HDL", "CHOLHDL", "VLDL", "LDLCALC", "LDLDIRECT"  CBC    Component Value Date/Time   WBC 6.0 12/01/2020 1010   WBC 6.6 11/06/2020 0255   RBC 4.33 12/01/2020 1010   RBC 3.98 (L) 11/06/2020 0255   HGB 14.9 12/01/2020 1010   HCT 44.1 12/01/2020 1010   PLT 184 12/01/2020 1010   MCV 102 (H) 12/01/2020 1010   MCH 34.4 (H) 12/01/2020 1010   MCH 34.9 (H) 11/06/2020 0255   MCHC 33.8 12/01/2020 1010   MCHC 34.2 11/06/2020 0255   RDW 12.0 12/01/2020 1010   LYMPHSABS 1.7 12/01/2020 1010   EOSABS 0.1 12/01/2020 1010   BASOSABS 0.0 12/01/2020 1010    Lab Results  Component Value Date  HGBA1C 4.9 03/16/2021    Assessment & Plan:  1. Essential  hypertension Controlled Counseled on blood pressure goal of less than 130/80, low-sodium, DASH diet, medication compliance, 150 minutes of moderate intensity exercise per week. Discussed medication compliance, adverse effects. - CMP14+EGFR - LP+Non-HDL Cholesterol - amLODipine (NORVASC) 10 MG tablet; Take 1 tablet (10 mg total) by mouth once daily.  Dispense: 90 tablet; Refill: 1  2. Right leg pain DVT versus venous insufficiency - VAS [place] LOWER EXTREMITY VENOUS (DVT); Future  3. 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 Need for hepatitis C screening test - HCV Ab w Reflex to Quant PCR  5. Epistaxis We will need to evaluate for possible bleeding disorder He endorses history of seeing his grandfather bleed for prolonged period of time but is unsure of a family history of bleeding disorder - PT AND PTT - CBC with Differential/Platelet  6. Paresthesia Negative for diabetes Denies alcohol intake stop all alcoholic neuropathy is unlikely - Vitamin B12  7. Mediastinal mass Previously advised to schedule follow-up with Dr. Kipp Brood CT surgery but he never did He promises to make this appointment.    Meds ordered this encounter  Medications   amLODipine (NORVASC) 10 MG tablet    Sig: Take 1 tablet (10 mg total) by mouth once daily.    Dispense:  90 tablet    Refill:  1    Follow-up: Return in about 3 months (around 07/27/2022) for Chronic medical conditions.    Addendum: Received a call from vascular lab that his right lower extremity Doppler was positive for DVT.  Prescription for Eliquis sent to his pharmacy.    Charlott Rakes, MD, FAAFP. [facility] and [facility] [place], [place]   04/26/2022, 12:09 PM

## 2022-04-26 NOTE — Addendum Note (Signed)
Addended by: Charlott Rakes on: 04/26/2022 01:46 PM   Modules accepted: Orders

## 2022-04-26 NOTE — Progress Notes (Signed)
Discuss blood clots Burning in legs. BP medication refill.

## 2022-04-26 NOTE — Progress Notes (Signed)
Right lower extremity  has been completed. Refer to [facility] under chart review to view preliminary results. Results given to Dr. Margarita Rana.   04/26/2022  1:42 PM Ruvim Risko, Bonnye Fava

## 2022-04-27 ENCOUNTER — Other Ambulatory Visit: Payer: Self-pay | Admitting: Family Medicine

## 2022-04-27 ENCOUNTER — Other Ambulatory Visit: Payer: Self-pay

## 2022-04-27 ENCOUNTER — Telehealth: Payer: Self-pay

## 2022-04-27 MED ORDER — ATORVASTATIN CALCIUM 20 MG PO TABS
20.0000 mg | ORAL_TABLET | Freq: Every day | ORAL | 6 refills | Status: DC
  Filled 2022-04-27 – 2022-09-19 (×2): qty 30, 30d supply, fill #0

## 2022-04-27 NOTE — Telephone Encounter (Signed)
Pt was called and informed of positive DVT scan and also informed of medication being sent to pharmacy.

## 2022-04-28 ENCOUNTER — Other Ambulatory Visit: Payer: Self-pay

## 2022-05-02 ENCOUNTER — Other Ambulatory Visit: Payer: Self-pay | Admitting: Family Medicine

## 2022-05-02 DIAGNOSIS — I82599 Chronic embolism and thrombosis of other specified deep vein of unspecified lower extremity: Secondary | ICD-10-CM

## 2022-05-02 LAB — CMP14+EGFR
ALT: 9 IU/L (ref 0–44)
AST: 16 IU/L (ref 0–40)
Albumin/Globulin Ratio: 1.3 (ref 1.2–2.2)
Albumin: 4.4 g/dL (ref 3.8–4.9)
Alkaline Phosphatase: 79 IU/L (ref 44–121)
BUN/Creatinine Ratio: 6 — ABNORMAL LOW (ref 9–20)
BUN: 6 mg/dL (ref 6–24)
Bilirubin Total: 0.4 mg/dL (ref 0.0–1.2)
CO2: 19 mmol/L — ABNORMAL LOW (ref 20–29)
Calcium: 9.5 mg/dL (ref 8.7–10.2)
Chloride: 104 mmol/L (ref 96–106)
Creatinine, Ser: 0.96 mg/dL (ref 0.76–1.27)
Globulin, Total: 3.4 g/dL (ref 1.5–4.5)
Glucose: 82 mg/dL (ref 70–99)
Potassium: 4.5 mmol/L (ref 3.5–5.2)
Sodium: 143 mmol/L (ref 134–144)
Total Protein: 7.8 g/dL (ref 6.0–8.5)
eGFR: 95 mL/min/{1.73_m2} (ref >59)

## 2022-05-02 LAB — LUPUS ANTICOAGULANT PANEL
Dilute Viper Venom Time: 34.8 s (ref 0.0–47.0)
PTT Lupus Anticoagulant: 32.5 s (ref 0.0–43.5)

## 2022-05-02 LAB — CBC WITH DIFFERENTIAL/PLATELET
Basophils Absolute: 0.1 10*3/uL (ref 0.0–0.2)
Basos: 1 %
EOS (ABSOLUTE): 0.2 10*3/uL (ref 0.0–0.4)
Eos: 3 %
Hematocrit: 42.6 % (ref 37.5–51.0)
Hemoglobin: 14.1 g/dL (ref 13.0–17.7)
Immature Grans (Abs): 0 10*3/uL (ref 0.0–0.1)
Immature Granulocytes: 1 %
Lymphocytes Absolute: 1.7 10*3/uL (ref 0.7–3.1)
Lymphs: 26 %
MCH: 34.1 pg — ABNORMAL HIGH (ref 26.6–33.0)
MCHC: 33.1 g/dL (ref 31.5–35.7)
MCV: 103 fL — ABNORMAL HIGH (ref 79–97)
Monocytes Absolute: 0.6 10*3/uL (ref 0.1–0.9)
Monocytes: 9 %
Neutrophils Absolute: 4.1 10*3/uL (ref 1.4–7.0)
Neutrophils: 60 %
Platelets: 209 10*3/uL (ref 150–450)
RBC: 4.13 x10E6/uL — ABNORMAL LOW (ref 4.14–5.80)
RDW: 12.8 % (ref 11.6–15.4)
WBC: 6.7 10*3/uL (ref 3.4–10.8)

## 2022-05-02 LAB — PROTEIN S, TOTAL AND FREE
Protein S Ag, Free: 147 % — ABNORMAL HIGH (ref 61–136)
Protein S Ag, Total: 132 % (ref 60–150)

## 2022-05-02 LAB — LP+NON-HDL CHOLESTEROL
Cholesterol, Total: 205 mg/dL — ABNORMAL HIGH (ref 100–199)
HDL: 59 mg/dL (ref >39)
LDL Chol Calc (NIH): 124 mg/dL — ABNORMAL HIGH (ref 0–99)
Total Non-HDL-Chol (LDL+VLDL): 146 mg/dL — ABNORMAL HIGH (ref 0–129)
Triglycerides: 122 mg/dL (ref 0–149)
VLDL Cholesterol Cal: 22 mg/dL (ref 5–40)

## 2022-05-02 LAB — FACTOR V LEIDEN

## 2022-05-02 LAB — VITAMIN B12: Vitamin B-12: 264 pg/mL (ref 232–1245)

## 2022-05-02 LAB — HCV INTERPRETATION

## 2022-05-02 LAB — PT AND PTT
INR: 1 (ref 0.9–1.2)
Prothrombin Time: 10.3 s (ref 9.1–12.0)
aPTT: 28 s (ref 24–33)

## 2022-05-02 LAB — BETA 2 MICROGLOBULIN, SERUM: Beta-2: 1.8 mg/L (ref 0.6–2.4)

## 2022-05-02 LAB — PROTEIN C, TOTAL: Protein C Antigen: 96 % (ref 60–150)

## 2022-05-02 LAB — HCV AB W REFLEX TO QUANT PCR: HCV Ab: NONREACTIVE

## 2022-05-02 LAB — FACTOR 2 ASSAY: Factor II Activity: 155 % — ABNORMAL HIGH (ref 50–154)

## 2022-05-03 ENCOUNTER — Other Ambulatory Visit: Payer: Self-pay

## 2022-05-09 ENCOUNTER — Telehealth: Payer: Self-pay | Admitting: Oncology

## 2022-05-09 NOTE — Telephone Encounter (Signed)
Scheduled appt per 7/18 referral. Pt is aware of appt date and time. Pt is aware to arrive 15 mins prior to appt time and to bring and updated insurance card. Pt is aware of appt location.   

## 2022-05-18 ENCOUNTER — Other Ambulatory Visit: Payer: Self-pay

## 2022-05-31 ENCOUNTER — Other Ambulatory Visit: Payer: Self-pay

## 2022-05-31 ENCOUNTER — Inpatient Hospital Stay: Payer: Medicare Other | Attending: Oncology | Admitting: Oncology

## 2022-05-31 VITALS — BP 132/86 | HR 67 | Temp 97.3°F | Resp 18 | Wt 181.1 lb

## 2022-05-31 DIAGNOSIS — I10 Essential (primary) hypertension: Secondary | ICD-10-CM | POA: Diagnosis not present

## 2022-05-31 DIAGNOSIS — Z7901 Long term (current) use of anticoagulants: Secondary | ICD-10-CM | POA: Insufficient documentation

## 2022-05-31 DIAGNOSIS — Z86718 Personal history of other venous thrombosis and embolism: Secondary | ICD-10-CM | POA: Insufficient documentation

## 2022-05-31 DIAGNOSIS — Z79899 Other long term (current) drug therapy: Secondary | ICD-10-CM | POA: Insufficient documentation

## 2022-05-31 DIAGNOSIS — I82411 Acute embolism and thrombosis of right femoral vein: Secondary | ICD-10-CM | POA: Insufficient documentation

## 2022-05-31 DIAGNOSIS — I824Y2 Acute embolism and thrombosis of unspecified deep veins of left proximal lower extremity: Secondary | ICD-10-CM

## 2022-05-31 DIAGNOSIS — J9859 Other diseases of mediastinum, not elsewhere classified: Secondary | ICD-10-CM | POA: Insufficient documentation

## 2022-05-31 DIAGNOSIS — Z86711 Personal history of pulmonary embolism: Secondary | ICD-10-CM | POA: Insufficient documentation

## 2022-05-31 DIAGNOSIS — I82491 Acute embolism and thrombosis of other specified deep vein of right lower extremity: Secondary | ICD-10-CM | POA: Insufficient documentation

## 2022-05-31 NOTE — Progress Notes (Signed)
Reason for the request:    Deep vein thrombosis  HPI: I was asked by Dr. Margarita Rana to evaluate Alex Glover for evaluation of deep vein thrombosis.  56 year old man with history of hypertension who was diagnosed with venous thromboembolism in January 2022.  At that time he presented with symptoms of shortness of breath and a CT scan showed a large central obstructive pulmonary embolism in the distal right main pulmonary artery extending into the right lower lobar/segmental pulmonary arteries.  He was also found to have left lower extremity DVT.  He was started on heparin and subsequently discharged on Eliquis.  He completed anticoagulation for 3 months.  He remained in his usual state of health till about 2 months ago he started developing right lower extremity swelling and pain and ultrasound lower extremity Doppler showed right acute deep vein thrombosis involving the right common femoral vein and saphenous vein junction.  There is also age intermediate deep vein thrombosis involving the right femoral vein.  Based on these findings he was restarted on Eliquis in the last 4 weeks.  Clinically, he reports increased swelling in his right lower extremity that has improved substantially in the last 4 weeks.  He denies any hematochezia, melena or hemoptysis.  He did not have any family history of recurrent thromboembolism.  He does not report any headaches, blurry vision, syncope or seizures. Does not report any fevers, chills or sweats.  Does not report any cough, wheezing or hemoptysis.  Does not report any chest pain, palpitation, orthopnea or leg edema.  Does not report any nausea, vomiting or abdominal pain.  Does not report any constipation or diarrhea.  Does not report any skeletal complaints.    Does not report frequency, urgency or hematuria.  Does not report any skin rashes or lesions. Does not report any heat or cold intolerance.  Does not report any lymphadenopathy or petechiae.  Does not report any anxiety  or depression.  Remaining review of systems is negative.     Past Medical History:  Diagnosis Date   Kidney stone   :  No past surgical history on file.:   Current Outpatient Medications:    amLODipine (NORVASC) 10 MG tablet, Take 1 tablet (10 mg total) by mouth once daily., Disp: 90 tablet, Rfl: 1   apixaban (ELIQUIS) 5 MG TABS tablet, Start with two tablets twice daily for 7 days. On day 8, switch to one tablet twice daily., Disp: 74 tablet, Rfl: 0   apixaban (ELIQUIS) 5 MG TABS tablet, Take 1 tablet (5 mg total) by mouth 2 (two) times daily. After completion of starter pack, Disp: 60 tablet, Rfl: 2   atorvastatin (LIPITOR) 20 MG tablet, Take 1 tablet (20 mg total) by mouth daily., Disp: 30 tablet, Rfl: 6   ibuprofen (ADVIL) 200 MG tablet, Take 400 mg by mouth every 6 (six) hours as needed for mild pain (or headaches)., Disp: , Rfl:    Multiple Vitamins-Minerals (ONE-A-DAY MENS HEALTH FORMULA) TABS, Take 1 tablet by mouth daily with breakfast., Disp: , Rfl: :  No Known Allergies:  No family history on file.:   Social History   Socioeconomic History   Marital status: Single    Spouse name: Not on file   Number of children: Not on file   Years of education: Not on file   Highest education level: Not on file  Occupational History   Not on file  Tobacco Use   Smoking status: Some Days   Smokeless tobacco: Never  Substance and Sexual Activity   Alcohol use: No   Drug use: No   Sexual activity: Not on file  Other Topics Concern   Not on file  Social History Narrative   Not on file   Social Determinants of Health   Financial Resource Strain: Not on file  Food Insecurity: Not on file  Transportation Needs: Not on file  Physical Activity: Not on file  Stress: Not on file  Social Connections: Not on file  Intimate Partner Violence: Not on file  :  Pertinent items are noted in HPI.  Exam:  General appearance: alert and cooperative appeared without distress. Head:  atraumatic without any abnormalities. Eyes: conjunctivae/corneas clear. PERRL.  Sclera anicteric. Throat: lips, mucosa, and tongue normal; without oral thrush or ulcers. Resp: clear to auscultation bilaterally without rhonchi, wheezes or dullness to percussion. Cardio: regular rate and rhythm, S1, S2 normal, no murmur, click, rub or gallop GI: soft, non-tender; bowel sounds normal; no masses,  no organomegaly Skin: Skin color, texture, turgor normal. No rashes or lesions Lymph nodes: Cervical, supraclavicular, and axillary nodes normal. Neurologic: Grossly normal without any motor, sensory or deep tendon reflexes. Musculoskeletal: No joint deformity or effusion.   Assessment and Plan:    56 year old with:  1.  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  Did not appear to be provoked and had a left deep vein thrombosis and pulmonary embolism he subsequently developed right DVT in July 2023.  Hypercoagulable panel completed in July 2023 showed no inherited or acquired thrombophilia  The natural course of these findings as well as treatment choices were discussed.  He is currently on Eliquis which I recommended continuing for the time being.  The duration of therapy at this time would be at least 6 months and possibly.  Given the fact that he had recurrent unprovoked deep vein thrombosis including pulmonary embolism I recommended lifetime.  If he has complications with anticoagulation this could be withheld after 6 months but certainly would not be a preferable solution.  He is agreeable to continue on Eliquis at this time.  2.  Mediastinal mass: Unclear etiology follow-up with thoracic surgery as recommended.   3.  Follow-up: I am happy to see him in the future as needed.  45  minutes were dedicated to this visit. The time was spent on reviewing laboratory data, imaging studies, discussing treatment options, and answering questions regarding future  plan.     A copy of this consult has been forwarded to the requesting physician.

## 2022-06-21 IMAGING — CT CT CHEST W/ CM
2 of 4 series · 15 of 36 positions shown, 18 images · IV contrast (omnipaque)
Comparison: Chest CT dated 11/04/2020

CLINICAL DATA: 52-year-old male with mediastinal mass.

EXAM:
CT CHEST WITH CONTRAST
TECHNIQUE: Multidetector CT imaging of the chest was performed during
intravenous contrast administration.
CONTRAST:  75mL OMNIPAQUE IOHEXOL 300 MG/ML  SOLN

[Series 2: axial st · axial · 0.74mm/px · z∈[-354,-28]mm · 12 of 193 slices shown, 15 images]
[im 15/193  mediastinal]
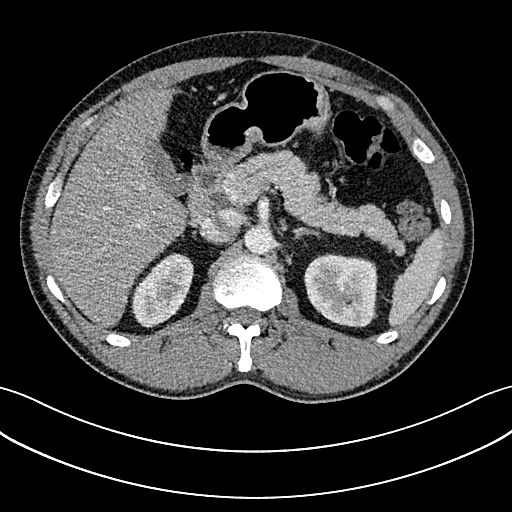
[im 15/193  lung]
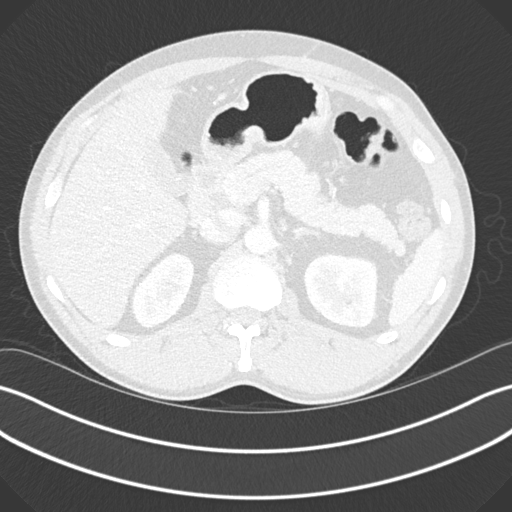
[im 30/193  lung]
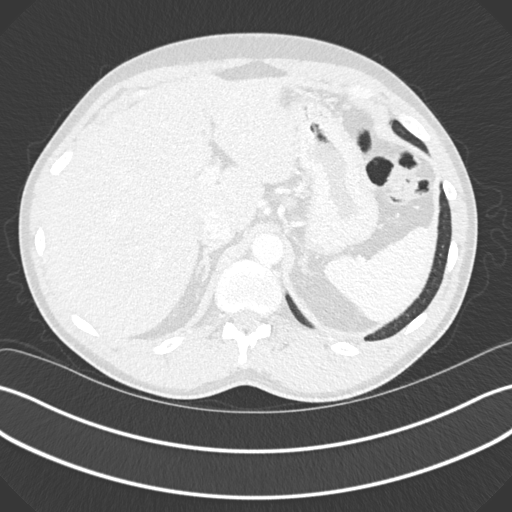
[im 45/193  lung]
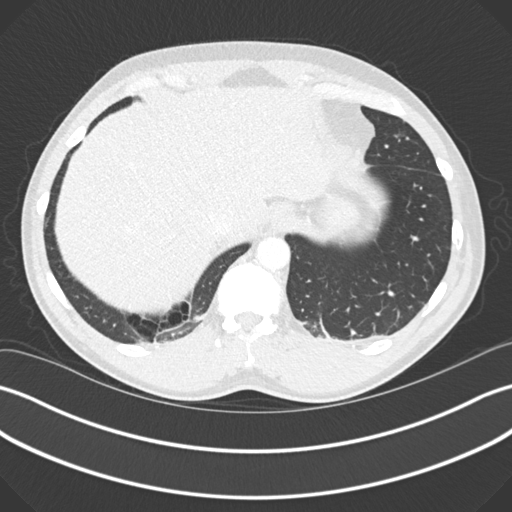
[im 60/193  lung]
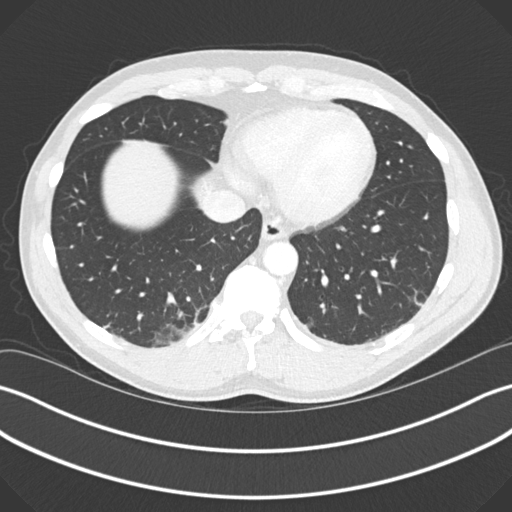
[im 74/193  mediastinal]
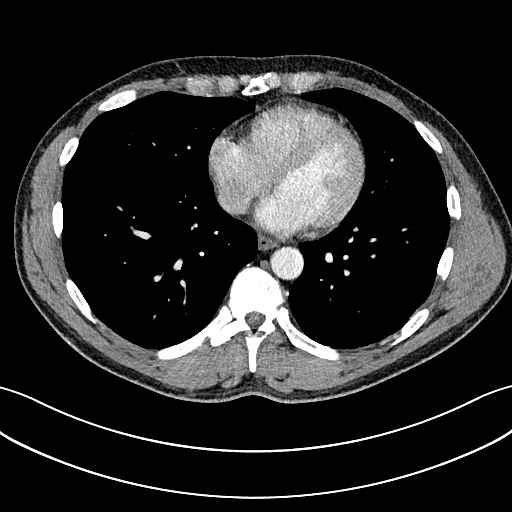
[im 74/193  lung]
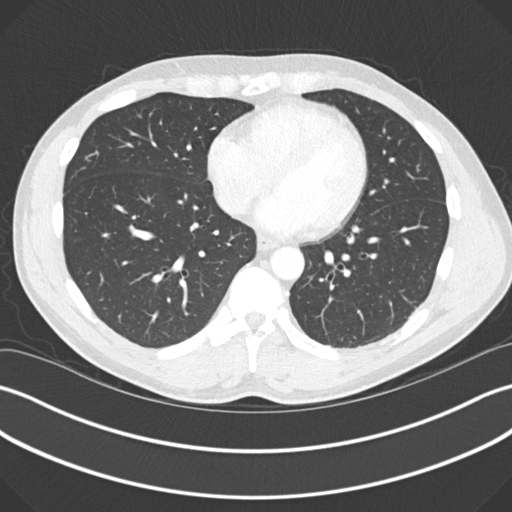
[im 89/193  lung]
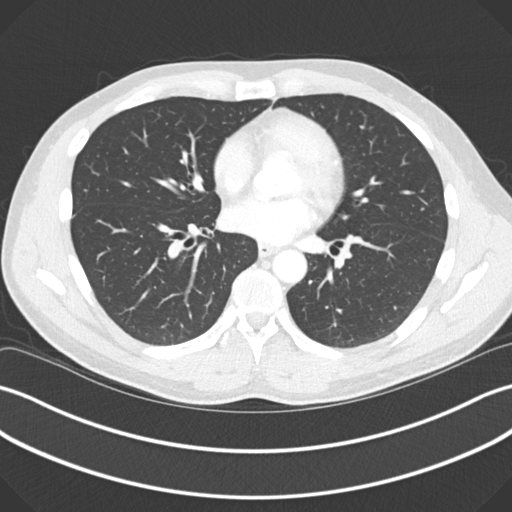
[im 104/193  lung]
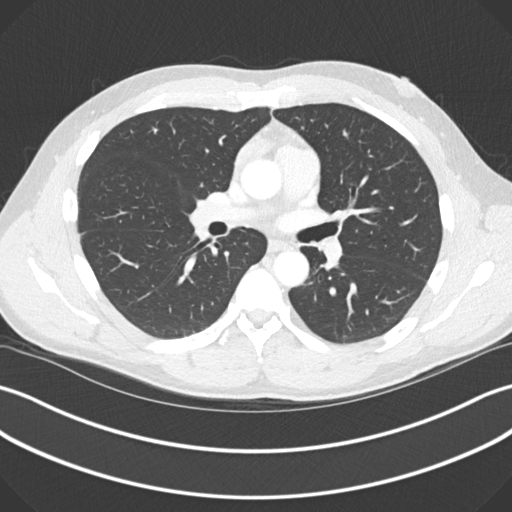
[im 119/193  lung]
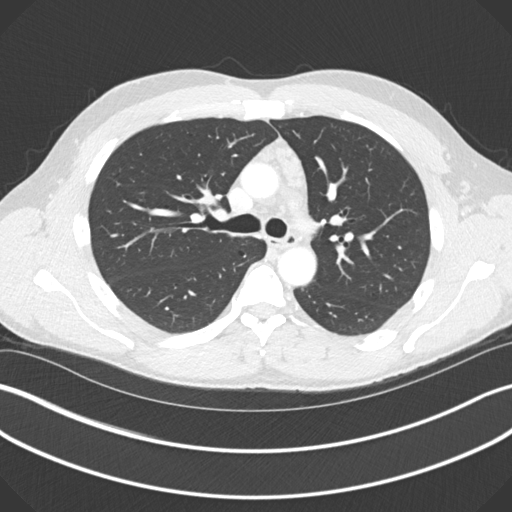
[im 133/193  mediastinal]
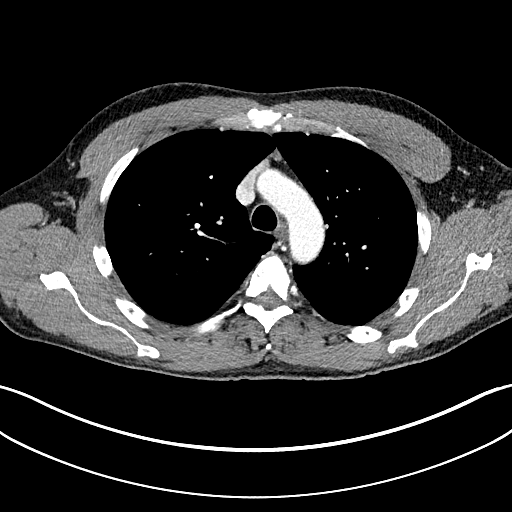
[im 133/193  lung]
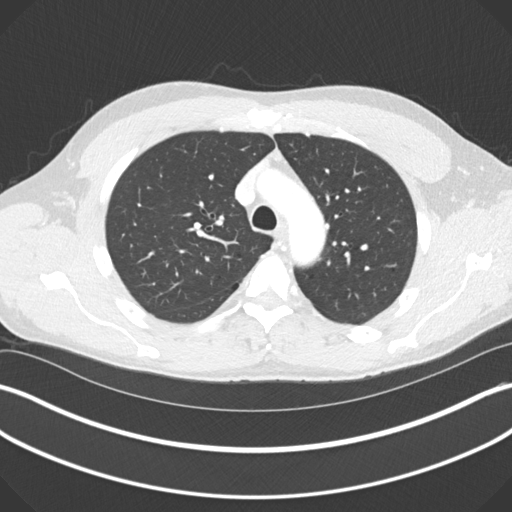
[im 148/193  lung]
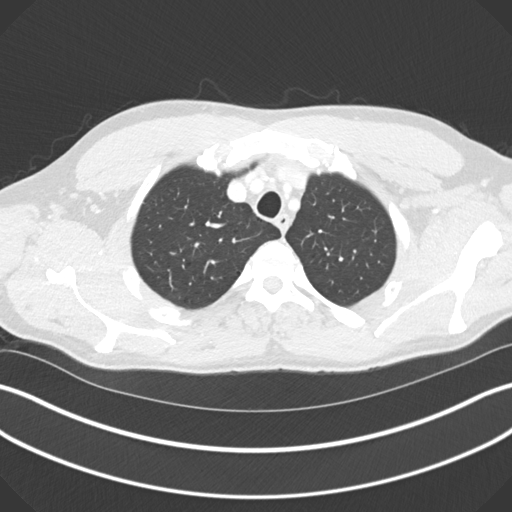
[im 163/193  lung]
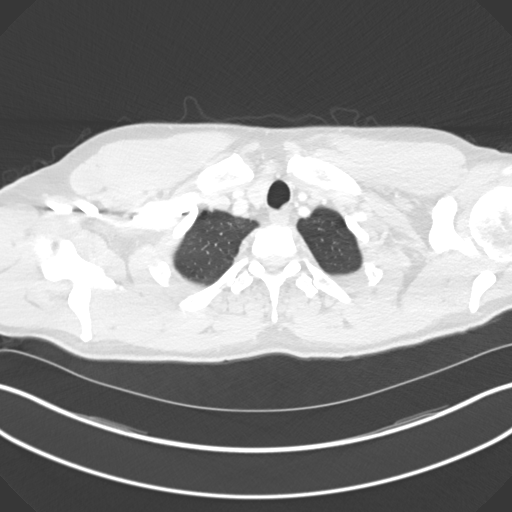
[im 178/193  lung]
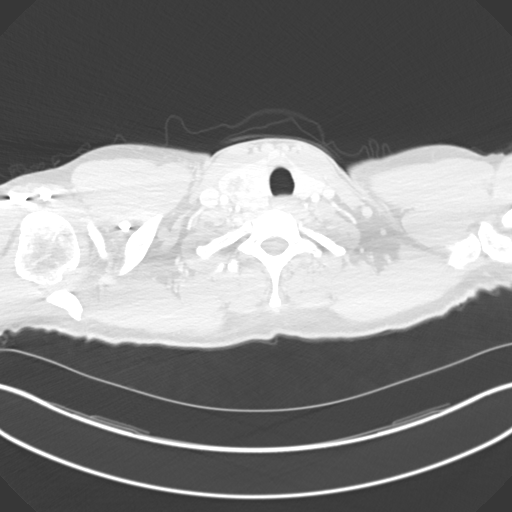

[Series 6: coronal · coronal · 0.81mm/px · 3 of 143 slices shown]
[im 29/143  lung]
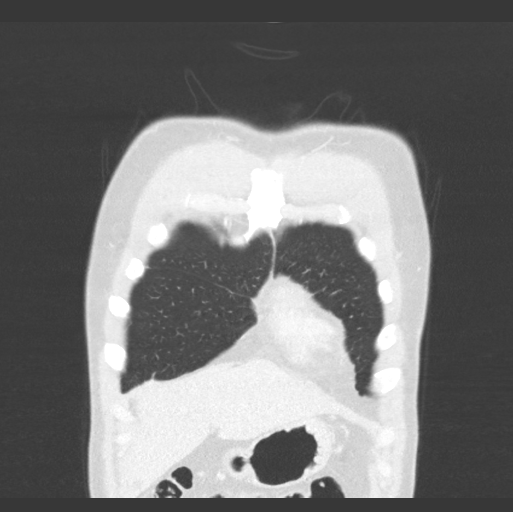
[im 57/143  lung]
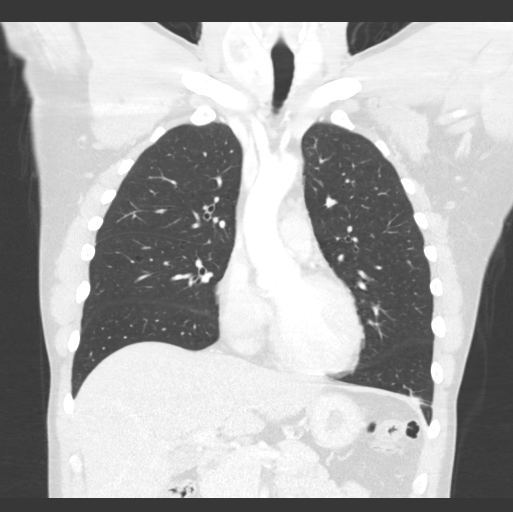
[im 86/143  lung]
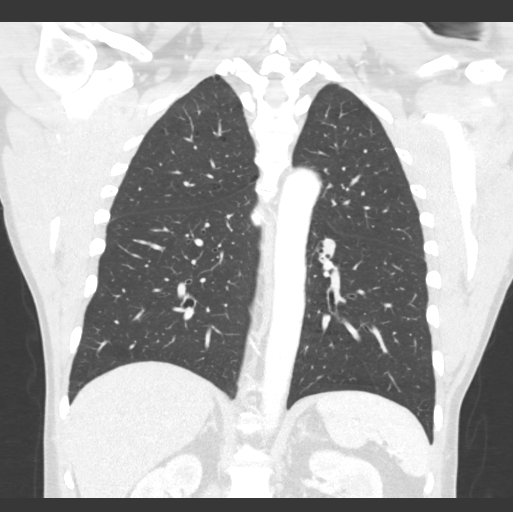

[15 of 36 positions shown; findings below may reference images not displayed]

FINDINGS: Cardiovascular: There is no cardiomegaly or pericardial effusion.
The thoracic aorta is unremarkable. Evaluation of the pulmonary
arteries is very limited due to suboptimal opacification and timing
of the contrast. No obvious large central pulmonary artery embolus
identified.

Mediastinum/Nodes: No hilar or mediastinal adenopathy. The esophagus
is grossly unremarkable. Heterogeneously enhancing mass with areas
of calcification in the right thyroid lobe measuring approximately
2.5 cm. This has been evaluated on previous imaging. (ref: [HOSPITAL]. [DATE]): 143-50).No mediastinal fluid collection.
Nodular tissue in the anterior mediastinum as seen previously may
represent thymic hyperplasia versus a neoplastic process. Overall no
significant interval change since the prior CT. Follow-up as per
recommendation of the prior CT.

Lungs/Pleura: There are bibasilar linear atelectasis/scarring. No
focal consolidation, pleural effusion, or pneumothorax. The central
airways are patent.

Upper Abdomen: There is a 15 mm stone or focal parenchymal
calcification in the posterior interpolar left kidney.

Musculoskeletal: No acute osseous pathology. Retroareolar nodular
densities, left greater right similar to prior CT, likely related to
gynecomastia.
IMPRESSION: 1. No acute intrathoracic pathology.
2. Nodular tissue in the anterior mediastinum as seen previously may
represent thymic hyperplasia versus a neoplastic process. Overall no
significant interval change since the prior CT.
3. A 15 mm stone or focal parenchymal calcification in the posterior
interpolar left kidney.

## 2022-07-31 ENCOUNTER — Ambulatory Visit: Payer: Medicare Other | Attending: Family Medicine | Admitting: Family Medicine

## 2022-07-31 ENCOUNTER — Encounter: Payer: Self-pay | Admitting: Family Medicine

## 2022-07-31 ENCOUNTER — Other Ambulatory Visit: Payer: Self-pay

## 2022-07-31 VITALS — BP 124/82 | HR 59 | Ht 73.0 in | Wt 188.4 lb

## 2022-07-31 DIAGNOSIS — Z86718 Personal history of other venous thrombosis and embolism: Secondary | ICD-10-CM | POA: Insufficient documentation

## 2022-07-31 DIAGNOSIS — I82599 Chronic embolism and thrombosis of other specified deep vein of unspecified lower extremity: Secondary | ICD-10-CM | POA: Diagnosis not present

## 2022-07-31 DIAGNOSIS — R222 Localized swelling, mass and lump, trunk: Secondary | ICD-10-CM | POA: Insufficient documentation

## 2022-07-31 DIAGNOSIS — I10 Essential (primary) hypertension: Secondary | ICD-10-CM | POA: Diagnosis not present

## 2022-07-31 DIAGNOSIS — I2699 Other pulmonary embolism without acute cor pulmonale: Secondary | ICD-10-CM | POA: Diagnosis not present

## 2022-07-31 DIAGNOSIS — Z86711 Personal history of pulmonary embolism: Secondary | ICD-10-CM | POA: Insufficient documentation

## 2022-07-31 DIAGNOSIS — M546 Pain in thoracic spine: Secondary | ICD-10-CM | POA: Diagnosis present

## 2022-07-31 DIAGNOSIS — Z7901 Long term (current) use of anticoagulants: Secondary | ICD-10-CM | POA: Diagnosis not present

## 2022-07-31 MED ORDER — APIXABAN 5 MG PO TABS
5.0000 mg | ORAL_TABLET | Freq: Two times a day (BID) | ORAL | 6 refills | Status: DC
  Filled 2022-07-31: qty 60, 30d supply, fill #0

## 2022-07-31 NOTE — Progress Notes (Signed)
Subjective:  Patient ID: Alex Coffin Sr., male    DOB: Jun 08, 1966  Age: 56 y.o. MRN: [id number]  CC: Neck Pain and Medication Refill   HPI Glover Alex Sr. is a 56 y.o. year old male with a history of hypertension, recurrent DVT and PE , thyroid nodule, mediastinal mass.  Interval History: Seen by heme-onc to determine duration of anticoagulation, notes reviewed and he was advised to continue with Eliquis  due to recurrent unprovoked DVT and PE. He has not had any episodes of epistaxis which she complained of as his last office visit 3 months ago.  He complains of right thoracic back pain even though he told the nurse he had neck pain x3 weeks and only recalled cutting the grass. He walks regularly and does not think he slept wrong.  Does not perform any heavy lifting He has used Aleve and a heating pad with some relief in symptoms but complains Aleve makes him sleepy. For his mediastinal mass he is yet to make an appointment with cardiothoracic surgery, Dr. Kipp Brood but promises to do so. Past Medical History:  Diagnosis Date   Kidney stone     History reviewed. No pertinent surgical history.  History reviewed. No pertinent family history.  Social History   Socioeconomic History   Marital status: Single    Spouse name: Not on file   Number of children: Not on file   Years of education: Not on file   Highest education level: Not on file  Occupational History   Not on file  Tobacco Use   Smoking status: Some Days   Smokeless tobacco: Never  Substance and Sexual Activity   Alcohol use: No   Drug use: No   Sexual activity: Not on file  Other Topics Concern   Not on file  Social History Narrative   Not on file   Social Determinants of Health   Financial Resource Strain: Not on file  Food Insecurity: Not on file  Transportation Needs: Not on file  Physical Activity: Not on file  Stress: Not on file  Social Connections: Not on file    No Known  Allergies  Outpatient Medications Prior to Visit  Medication Sig Dispense Refill   amLODipine (NORVASC) 10 MG tablet Take 1 tablet (10 mg total) by mouth once daily. 90 tablet 1   atorvastatin (LIPITOR) 20 MG tablet Take 1 tablet (20 mg total) by mouth daily. 30 tablet 6   ibuprofen (ADVIL) 200 MG tablet Take 400 mg by mouth every 6 (six) hours as needed for mild pain (or headaches).     Multiple Vitamins-Minerals (ONE-A-DAY MENS HEALTH FORMULA) TABS Take 1 tablet by mouth daily with breakfast.     apixaban (ELIQUIS) 5 MG TABS tablet Start with two tablets twice daily for 7 days. On day 8, switch to one tablet twice daily. 74 tablet 0   apixaban (ELIQUIS) 5 MG TABS tablet Take 1 tablet (5 mg total) by mouth 2 (two) times daily. After completion of starter pack 60 tablet 2   No facility-administered medications prior to visit.     ROS Review of Systems  Constitutional:  Negative for activity change and appetite change.  HENT:  Negative for sinus pressure and sore throat.   Respiratory:  Negative for chest tightness, shortness of breath and wheezing.   Cardiovascular:  Negative for chest pain and palpitations.  Gastrointestinal:  Negative for abdominal distention, abdominal pain and constipation.  Genitourinary: Negative.   Musculoskeletal: Negative.  See HPI  Psychiatric/Behavioral:  Negative for behavioral problems and dysphoric mood.     Objective:  BP 124/82   Pulse (!) 59   Ht '6\' 1"'$  (1.854 m)   Wt 188 lb 6.4 oz (85.5 kg)   SpO2 99%   BMI 24.86 kg/m      07/31/2022    4:00 PM 05/31/2022   11:41 AM 04/26/2022   11:09 AM  BP/Weight  Systolic BP 833 825 053  Diastolic BP 82 86 79  Wt. (Lbs) 188.4 181.13 185.2  BMI 24.86 kg/m2 23.9 kg/m2 24.43 kg/m2      Physical Exam Constitutional:      Appearance: He is well-developed.  Cardiovascular:     Rate and Rhythm: Bradycardia present.     Heart sounds: Normal heart sounds. No murmur heard. Pulmonary:     Effort:  Pulmonary effort is normal.     Breath sounds: Normal breath sounds. No wheezing or rales.  Chest:     Chest wall: No tenderness.  Abdominal:     General: Bowel sounds are normal. There is no distension.     Palpations: Abdomen is soft. There is no mass.     Tenderness: There is no abdominal tenderness.  Musculoskeletal:        General: Normal range of motion.     Right lower leg: No edema.     Left lower leg: No edema.     Comments: Slight tenderness on palpation of thoracic region, right lateral aspect of back extending from T1 to about T8. Reproducible pain in upper right thoracic region on twisting motion of the spine. Normal range of motion of bilateral upper extremity.  Neurological:     Mental Status: He is alert and oriented to person, place, and time.  Psychiatric:        Mood and Affect: Mood normal.        Latest Ref Rng & Units 04/26/2022   12:10 PM 01/20/2021    2:52 PM 12/01/2020   10:10 AM  CMP  Glucose 70 - 99 mg/dL 82   95   BUN 6 - 24 mg/dL 6   8   Creatinine 0.76 - 1.27 mg/dL 0.96  1.10  0.97   Sodium 134 - 144 mmol/L 143   139   Potassium 3.5 - 5.2 mmol/L 4.5   4.4   Chloride 96 - 106 mmol/L 104   101   CO2 20 - 29 mmol/L 19   22   Calcium 8.7 - 10.2 mg/dL 9.5   9.7   Total Protein 6.0 - 8.5 g/dL 7.8   7.9   Total Bilirubin 0.0 - 1.2 mg/dL 0.4   0.3   Alkaline Phos 44 - 121 IU/L 79   66   AST 0 - 40 IU/L 16   21   ALT 0 - 44 IU/L 9   13     Lipid Panel     Component Value Date/Time   CHOL 205 (H) 04/26/2022 1210   TRIG 122 04/26/2022 1210   HDL 59 04/26/2022 1210   LDLCALC 124 (H) 04/26/2022 1210    CBC    Component Value Date/Time   WBC 6.7 04/26/2022 1210   WBC 6.6 11/06/2020 0255   RBC 4.13 (L) 04/26/2022 1210   RBC 3.98 (L) 11/06/2020 0255   HGB 14.1 04/26/2022 1210   HCT 42.6 04/26/2022 1210   PLT 209 04/26/2022 1210   MCV 103 (H) 04/26/2022 1210   MCH 34.1 (H) 04/26/2022  1210   MCH 34.9 (H) 11/06/2020 0255   MCHC 33.1 04/26/2022  1210   MCHC 34.2 11/06/2020 0255   RDW 12.8 04/26/2022 1210   LYMPHSABS 1.7 04/26/2022 1210   EOSABS 0.2 04/26/2022 1210   BASOSABS 0.1 04/26/2022 1210    Lab Results  Component Value Date   HGBA1C 4.9 03/16/2021    Assessment & Plan:  1. Acute right-sided thoracic back pain Likely muscle spasm He does have leftover muscle relaxants at home Advised to apply heat or ice whichever is tolerated to painful areas. Counseled on evidence of improvement in pain control with regards to yoga, water aerobics, massage, home physical therapy, exercise as tolerated. - Ambulatory referral to Physical Therapy  2. 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 After completion of starter pack  Dispense: 60 tablet; Refill: 6  3. Essential hypertension Controlled Continue amlodipine Counseled on blood pressure goal of less than 130/80, low-sodium, DASH diet, medication compliance, 150 minutes of moderate intensity exercise per week. Discussed medication compliance, adverse effects.    Health Care Maintenance: Declines flu shot.  He would like to think about shingles vaccine. Meds ordered this encounter  Medications   apixaban (ELIQUIS) 5 MG TABS tablet    Sig: Take 1 tablet (5 mg total) by mouth 2 (two) times daily. After completion of starter pack    Dispense:  60 tablet    Refill:  6    Follow-up: Return in about 6 months (around 01/30/2023) for Chronic medical conditions.       Charlott Rakes, MD, FAAFP. [facility] and [place], [place]   07/31/2022, 4:35 PM

## 2022-07-31 NOTE — Patient Instructions (Signed)
Muscle Cramps and Spasms Muscle cramps and spasms occur when a muscle or muscles tighten and you have no control over this tightening (involuntary muscle contraction). They are a common problem and can develop in any muscle. The most common place is in the calf muscles of the leg. Muscle cramps and muscle spasms are both involuntary muscle contractions, but there are some differences between the two: Muscle cramps are painful. They come and go and may last for a few seconds or up to 15 minutes. Muscle cramps are often more forceful and last longer than muscle spasms. Muscle spasms may or may not be painful. They may also last just a few seconds or much longer. Certain medical conditions, such as diabetes or Parkinson's disease, can make it more likely to develop cramps or spasms. However, cramps or spasms are usually not caused by a serious underlying problem. Common causes include: Doing more physical work or exercise than your body is ready for (overexertion). Overuse from repeating certain movements too many times. Remaining in a certain position for a long period of time. Improper preparation, form, or technique while playing a sport or doing an activity. Dehydration. Injury. Side effects of some medicines. Abnormally low levels of the salts and minerals in your blood (electrolytes), especially potassium and calcium. This could happen if you are taking water pills (diuretics) or if you are pregnant. In many cases, the cause of muscle cramps or spasms is not known. Follow these instructions at home: Managing pain and stiffness     Try massaging, stretching, and relaxing the affected muscle. Do this for several minutes at a time. If directed, apply heat to tight or tense muscles as often as told by your health care provider. Use the heat source that your health care provider recommends, such as a moist heat pack or a heating pad. Place a towel between your skin and the heat source. Leave the  heat on for 20-30 minutes. Remove the heat if your skin turns bright red. This is especially important if you are unable to feel pain, heat, or cold. You may have a greater risk of getting burned. If directed, put ice on the affected area. This may help if you are sore or have pain after a cramp or spasm. Put ice in a plastic bag. Place a towel between your skin and the bag. Leave the ice on for 20 minutes, 2-3 times a day. Try taking hot showers or baths to help relax tight muscles. Eating and drinking Drink enough fluid to keep your urine pale yellow. Staying well hydrated may help prevent cramps or spasms. Eat a healthy diet that includes plenty of nutrients to help your muscles function. A healthy diet includes fruits and vegetables, lean protein, whole grains, and low-fat or nonfat dairy products. General instructions If you are having frequent cramps, avoid intense exercise for several days. Take over-the-counter and prescription medicines only as told by your health care provider. Pay attention to any changes in your symptoms. Keep all follow-up visits as told by your health care provider. This is important. Contact a health care provider if: Your cramps or spasms get more severe or happen more often. Your cramps or spasms do not improve over time. Summary Muscle cramps and spasms occur when a muscle or muscles tighten and you have no control over this tightening (involuntary muscle contraction). The most common place for cramps or spasms to occur is in the calf muscles of the leg. Massaging, stretching, and relaxing the affected   muscle may relieve the cramp or spasm. Drink enough fluid to keep your urine pale yellow. Staying well hydrated may help prevent cramps or spasms. This information is not intended to replace advice given to you by your health care provider. Make sure you discuss any questions you have with your health care provider. Document Revised: 04/22/2021 Document  Reviewed: 04/22/2021 Elsevier Patient Education  2023 Elsevier Inc.  

## 2022-08-01 ENCOUNTER — Other Ambulatory Visit: Payer: Self-pay

## 2022-08-22 ENCOUNTER — Other Ambulatory Visit: Payer: Self-pay

## 2022-08-25 ENCOUNTER — Other Ambulatory Visit: Payer: Self-pay

## 2022-08-25 DIAGNOSIS — I82599 Chronic embolism and thrombosis of other specified deep vein of unspecified lower extremity: Secondary | ICD-10-CM

## 2022-08-25 MED ORDER — APIXABAN 5 MG PO TABS: 5.0000 mg | ORAL_TABLET | Freq: Two times a day (BID) | ORAL | 6 refills | Status: DC

## 2022-08-25 NOTE — Telephone Encounter (Signed)
Alex Glover, [facility] with [employer] Pt assistance program requesting refill.   Requested Prescriptions  Pending Prescriptions Disp Refills   apixaban (ELIQUIS) 5 MG TABS tablet 60 tablet 6    Sig: Take 1 tablet (5 mg total) by mouth 2 (two) times daily. After completion of starter pack     Hematology:  Anticoagulants - apixaban Passed - 08/25/2022 10:22 AM      Passed - PLT in normal range and within 360 days    Platelets  Date Value Ref Range Status  04/26/2022 209 150 - 450 x10E3/uL Final         Passed - HGB in normal range and within 360 days    Hemoglobin  Date Value Ref Range Status  04/26/2022 14.1 13.0 - 17.7 g/dL Final         Passed - HCT in normal range and within 360 days    Hematocrit  Date Value Ref Range Status  04/26/2022 42.6 37.5 - 51.0 % Final         Passed - Cr in normal range and within 360 days    Creatinine, Ser  Date Value Ref Range Status  04/26/2022 0.96 0.76 - 1.27 mg/dL Final         Passed - AST in normal range and within 360 days    AST  Date Value Ref Range Status  04/26/2022 16 0 - 40 IU/L Final         Passed - ALT in normal range and within 360 days    ALT  Date Value Ref Range Status  04/26/2022 9 0 - 44 IU/L Final         Passed - Valid encounter within last 12 months    Recent Outpatient Visits           3 weeks ago Acute right-sided thoracic back pain   [place], Charlane Ferretti, MD   4 months ago Right leg pain   [place], Charlane Ferretti, MD   1 year ago Essential hypertension   [place], Charlane Ferretti, MD   1 year ago [place], En[place]ong, MD   1 year ago Mediastinal mass   [place], Enobong, MD       Future Appointments             In 5 months Alex Rakes, MD [place]

## 2022-09-19 ENCOUNTER — Other Ambulatory Visit: Payer: Self-pay

## 2022-09-20 ENCOUNTER — Other Ambulatory Visit: Payer: Self-pay | Admitting: Family Medicine

## 2022-09-20 ENCOUNTER — Other Ambulatory Visit: Payer: Self-pay

## 2022-09-20 DIAGNOSIS — R202 Paresthesia of skin: Secondary | ICD-10-CM

## 2022-09-20 MED ORDER — GABAPENTIN 300 MG PO CAPS
300.0000 mg | ORAL_CAPSULE | Freq: Every day | ORAL | 3 refills | Status: DC
  Filled 2022-09-20: qty 30, 30d supply, fill #0

## 2022-09-21 ENCOUNTER — Other Ambulatory Visit: Payer: Self-pay

## 2022-09-25 ENCOUNTER — Other Ambulatory Visit: Payer: Self-pay

## 2022-09-26 ENCOUNTER — Other Ambulatory Visit: Payer: Self-pay

## 2023-01-30 ENCOUNTER — Ambulatory Visit: Payer: Medicare Other | Admitting: Family Medicine

## 2023-02-06 ENCOUNTER — Ambulatory Visit: Payer: Medicare Other | Attending: Family Medicine | Admitting: Family Medicine

## 2023-02-06 ENCOUNTER — Other Ambulatory Visit: Payer: Self-pay

## 2023-02-06 ENCOUNTER — Encounter: Payer: Self-pay | Admitting: Family Medicine

## 2023-02-06 VITALS — BP 127/83 | HR 70 | Ht 73.0 in | Wt 195.8 lb

## 2023-02-06 DIAGNOSIS — J9859 Other diseases of mediastinum, not elsewhere classified: Secondary | ICD-10-CM | POA: Diagnosis not present

## 2023-02-06 DIAGNOSIS — I10 Essential (primary) hypertension: Secondary | ICD-10-CM | POA: Insufficient documentation

## 2023-02-06 DIAGNOSIS — I82599 Chronic embolism and thrombosis of other specified deep vein of unspecified lower extremity: Secondary | ICD-10-CM | POA: Diagnosis not present

## 2023-02-06 DIAGNOSIS — R202 Paresthesia of skin: Secondary | ICD-10-CM | POA: Diagnosis not present

## 2023-02-06 DIAGNOSIS — E041 Nontoxic single thyroid nodule: Secondary | ICD-10-CM | POA: Insufficient documentation

## 2023-02-06 DIAGNOSIS — F17200 Nicotine dependence, unspecified, uncomplicated: Secondary | ICD-10-CM | POA: Diagnosis not present

## 2023-02-06 DIAGNOSIS — Z7901 Long term (current) use of anticoagulants: Secondary | ICD-10-CM | POA: Insufficient documentation

## 2023-02-06 DIAGNOSIS — R222 Localized swelling, mass and lump, trunk: Secondary | ICD-10-CM | POA: Diagnosis not present

## 2023-02-06 DIAGNOSIS — Z79899 Other long term (current) drug therapy: Secondary | ICD-10-CM | POA: Diagnosis not present

## 2023-02-06 DIAGNOSIS — I82509 Chronic embolism and thrombosis of unspecified deep veins of unspecified lower extremity: Secondary | ICD-10-CM | POA: Insufficient documentation

## 2023-02-06 DIAGNOSIS — Z131 Encounter for screening for diabetes mellitus: Secondary | ICD-10-CM

## 2023-02-06 DIAGNOSIS — Z86711 Personal history of pulmonary embolism: Secondary | ICD-10-CM | POA: Insufficient documentation

## 2023-02-06 MED ORDER — APIXABAN 5 MG PO TABS
5.0000 mg | ORAL_TABLET | Freq: Two times a day (BID) | ORAL | 1 refills | Status: DC
Start: 2023-02-06 — End: 2023-03-23
  Filled 2023-02-06: qty 180, 90d supply, fill #0

## 2023-02-06 MED ORDER — ATORVASTATIN CALCIUM 20 MG PO TABS
20.0000 mg | ORAL_TABLET | Freq: Every day | ORAL | 1 refills | Status: DC
  Filled 2023-02-06: qty 90, 90d supply, fill #0

## 2023-02-06 MED ORDER — AMLODIPINE BESYLATE 10 MG PO TABS
10.0000 mg | ORAL_TABLET | Freq: Every day | ORAL | 1 refills | Status: DC
Start: 2023-02-06 — End: 2023-11-13
  Filled 2023-02-06: qty 90, 90d supply, fill #0

## 2023-02-06 MED ORDER — GABAPENTIN 300 MG PO CAPS
300.0000 mg | ORAL_CAPSULE | Freq: Every day | ORAL | 1 refills | Status: DC
Start: 2023-02-06 — End: 2023-11-13
  Filled 2023-02-06: qty 90, 90d supply, fill #0
  Filled 2023-10-24: qty 90, 90d supply, fill #1

## 2023-02-06 NOTE — Progress Notes (Signed)
Refill on Gabapentin

## 2023-02-06 NOTE — Patient Instructions (Signed)
Paresthesia Paresthesia is a burning or prickling feeling. This feeling can happen in any part of the body. It often happens in the hands, arms, legs, or feet. Usually, it is not painful. In most cases, the feeling goes away in a short time and is not a sign of a serious problem. If you have paresthesia that lasts a long time, you need to see your doctor. Follow these instructions at home: Nutrition Eat a healthy diet. This includes: Eating foods that are high in fiber. These include beans, whole grains, and fresh fruits and vegetables. Limiting foods that are high in fat and sugar. These include fried or sweet foods.  Alcohol use  Do not drink alcohol if: Your doctor tells you not to drink. You are pregnant, may be pregnant, or are planning to become pregnant. If you drink alcohol: Limit how much you have to: 0-1 drink a day for women. 0-2 drinks a day for men. Know how much alcohol is in your drink. In the [place] one drink equals one 12 oz bottle of beer (355 mL), one 5 oz glass of wine (148 mL), or one 1 oz glass of hard liquor (44 mL). General instructions Take over-the-counter and prescription medicines only as told by your doctor. Do not smoke or use any products that contain nicotine or tobacco. If you need help quitting, ask your doctor. If you have diabetes, work with your doctor to make sure your blood sugar stays in a healthy range. If your feet feel numb: Check for redness, warmth, and swelling every day. Wear padded socks and comfortable shoes. These help protect your feet. Keep all follow-up visits. Contact a doctor if: You have paresthesia that gets worse or does not go away. You lose feeling (have numbness) after an injury. Your burning or prickling feeling gets worse when you walk. You have pain or cramps. You feel dizzy or you faint. You have a rash. Get help right away if: You feel weak or have new weakness in an arm or leg. You have trouble walking or  moving. You have problems speaking, understanding, or seeing. You feel confused. You cannot control when you pee (urinate) or poop (have a bowel movement). These symptoms may be an emergency. Get help right away. Call 911. Do not wait to see if the symptoms will go away. Do not drive yourself to the hospital. Summary Paresthesia is a burning or prickling feeling. It often happens in the hands, arms, legs, or feet. In most cases, the feeling goes away in a short time and is not a sign of a serious problem. If you have paresthesia that lasts a long time, you need to be seen by your doctor. This information is not intended to replace advice given to you by your health care provider. Make sure you discuss any questions you have with your health care provider. Document Revised: 06/13/2021 Document Reviewed: 06/13/2021 Elsevier Patient Education  2023 [employer] Inc.  

## 2023-02-06 NOTE — Progress Notes (Signed)
Subjective:  Patient ID: Alex Antonio Sr., male    DOB: 10/15/1966  Age: 57 y.o. MRN: [id number]  CC: Hypertension   HPI Alex PHUNG Sr. is a 57 y.o. year old male with a history of hypertension, recurrent DVT and PE , thyroid nodule, mediastinal mass.   Interval History: He has been noncompliant with referrals to specialists. In 2022, I had referred him to endocrine in regards to his thyroid nodule but he never followed up. Ultrasound Thyroid from 01/2021: IMPRESSION: Right thyroid nodule measuring up to 4.5 cm. This is a TR 4 nodule. Recommend biopsy of this nodule.    He is yet to see cardiothoracic surgery for follow-up of his mediastinal mass even though we have discussed this again and again at several visits.  2 months ago he experienced swelling in his legs with associated pain and he had to double up on his Eliquis for 1 week and after that he resumed his regular dose.  At the moment he has no swelling in his legs. He continues to have 'pins and needle sensation' in his legs. Denies presence of paresthesia in his arms.  He has no known history of diabetes mellitus.  Gabapentin helps him sleep and not experience those sensations. Endorses adherence with his antihypertensive. Past Medical History:  Diagnosis Date   Kidney stone     No past surgical history on file.  No family history on file.  Social History   Socioeconomic History   Marital status: Single    Spouse name: Not on file   Number of children: Not on file   Years of education: Not on file   Highest education level: Not on file  Occupational History   Not on file  Tobacco Use   Smoking status: Some Days   Smokeless tobacco: Never  Substance and Sexual Activity   Alcohol use: No   Drug use: No   Sexual activity: Not on file  Other Topics Concern   Not on file  Social History Narrative   Not on file   Social Determinants of Health   Financial Resource Strain: Not on file  Food  Insecurity: Not on file  Transportation Needs: Not on file  Physical Activity: Not on file  Stress: Not on file  Social Connections: Not on file    No Known Allergies  Outpatient Medications Prior to Visit  Medication Sig Dispense Refill   Multiple Vitamins-Minerals (ONE-A-DAY MENS HEALTH FORMULA) TABS Take 1 tablet by mouth daily with breakfast.     amLODipine (NORVASC) 10 MG tablet Take 1 tablet (10 mg total) by mouth once daily. 90 tablet 1   apixaban (ELIQUIS) 5 MG TABS tablet Take 1 tablet (5 mg total) by mouth 2 (two) times daily. After completion of starter pack 60 tablet 6   atorvastatin (LIPITOR) 20 MG tablet Take 1 tablet (20 mg total) by mouth daily. 30 tablet 6   gabapentin (NEURONTIN) 300 MG capsule Take 1 capsule (300 mg total) by mouth at bedtime. 30 capsule 3   ibuprofen (ADVIL) 200 MG tablet Take 400 mg by mouth every 6 (six) hours as needed for mild pain (or headaches).     No facility-administered medications prior to visit.     ROS Review of Systems  Constitutional:  Negative for activity change and appetite change.  HENT:  Negative for sinus pressure and sore throat.   Respiratory:  Negative for chest tightness, shortness of breath and wheezing.   Cardiovascular:  Negative for  chest pain and palpitations.  Gastrointestinal:  Negative for abdominal distention, abdominal pain and constipation.  Genitourinary: Negative.   Musculoskeletal: Negative.   Psychiatric/Behavioral:  Negative for behavioral problems and dysphoric mood.     Objective:  BP 127/83   Pulse 70   Ht 6\' 1"  (1.854 m)   Wt 195 lb 12.8 oz (88.8 kg)   SpO2 99%   BMI 25.83 kg/m      02/06/2023   10:42 AM 07/31/2022    4:00 PM 05/31/2022   11:41 AM  BP/Weight  Systolic BP 127 124 132  Diastolic BP 83 82 86  Wt. (Lbs) 195.8 188.4 181.13  BMI 25.83 kg/m2 24.86 kg/m2 23.9 kg/m2      Physical Exam Constitutional:      Appearance: He is well-developed.  Cardiovascular:     Rate and  Rhythm: Normal rate.     Heart sounds: Normal heart sounds. No murmur heard. Pulmonary:     Effort: Pulmonary effort is normal.     Breath sounds: Normal breath sounds. No wheezing or rales.  Chest:     Chest wall: No tenderness.  Abdominal:     General: Bowel sounds are normal. There is no distension.     Palpations: Abdomen is soft. There is no mass.     Tenderness: There is no abdominal tenderness.  Musculoskeletal:        General: Normal range of motion.     Right lower leg: No edema.     Left lower leg: No edema.  Neurological:     Mental Status: He is alert and oriented to person, place, and time.  Psychiatric:        Mood and Affect: Mood normal.        Latest Ref Rng & Units 04/26/2022   12:10 PM 01/20/2021    2:52 PM 12/01/2020   10:10 AM  CMP  Glucose 70 - 99 mg/dL 82   95   BUN 6 - 24 mg/dL 6   8   Creatinine 6.04 - 1.27 mg/dL 5.40  9.81  1.91   Sodium 134 - 144 mmol/L 143   139   Potassium 3.5 - 5.2 mmol/L 4.5   4.4   Chloride 96 - 106 mmol/L 104   101   CO2 20 - 29 mmol/L 19   22   Calcium 8.7 - 10.2 mg/dL 9.5   9.7   Total Protein 6.0 - 8.5 g/dL 7.8   7.9   Total Bilirubin 0.0 - 1.2 mg/dL 0.4   0.3   Alkaline Phos 44 - 121 IU/L 79   66   AST 0 - 40 IU/L 16   21   ALT 0 - 44 IU/L 9   13     Lipid Panel     Component Value Date/Time   CHOL 205 (H) 04/26/2022 1210   TRIG 122 04/26/2022 1210   HDL 59 04/26/2022 1210   LDLCALC 124 (H) 04/26/2022 1210    CBC    Component Value Date/Time   WBC 6.7 04/26/2022 1210   WBC 6.6 11/06/2020 0255   RBC 4.13 (L) 04/26/2022 1210   RBC 3.98 (L) 11/06/2020 0255   HGB 14.1 04/26/2022 1210   HCT 42.6 04/26/2022 1210   PLT 209 04/26/2022 1210   MCV 103 (H) 04/26/2022 1210   MCH 34.1 (H) 04/26/2022 1210   MCH 34.9 (H) 11/06/2020 0255   MCHC 33.1 04/26/2022 1210   MCHC 34.2 11/06/2020 0255   RDW 12.8  04/26/2022 1210   LYMPHSABS 1.7 04/26/2022 1210   EOSABS 0.2 04/26/2022 1210   BASOSABS 0.1 04/26/2022 1210     Lab Results  Component Value Date   HGBA1C 4.9 03/16/2021    Assessment & Plan:  1. Essential hypertension Controlled Continue antihypertensive Counseled on blood pressure goal of less than 130/80, low-sodium, DASH diet, medication compliance, 150 minutes of moderate intensity exercise per week. Discussed medication compliance, adverse effects. - CMP14+EGFR - CBC with Differential/Platelet - amLODipine (NORVASC) 10 MG tablet; Take 1 tablet (10 mg total) by mouth once daily.  Dispense: 90 tablet; Refill: 1 - LP+Non-HDL Cholesterol  2. 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  Dispense: 180 tablet; Refill: 1  3. Mediastinal mass Referred to cardiothoracic surgery on different occasions but he never followed through Discussed that it is concerning that he could possibly have a malignancy and he needs to keep his appointment - Ambulatory referral to Cardiothoracic Surgery  4. Paresthesia Unknown etiology in the absence of alcohol consumption Will screen for diabetes and B12 deficiency - gabapentin (NEURONTIN) 300 MG capsule; Take 1 capsule (300 mg total) by mouth at bedtime.  Dispense: 90 capsule; Refill: 1 - Vitamin B12  5. Thyroid nodule He needs a biopsy Referred in the past endocrine but he never made it there Will repeat a referral again today and he has been encouraged to follow through - Ambulatory referral to Endocrinology  6. Screening for diabetes mellitus - Hemoglobin A1c  Meds ordered this encounter  Medications   amLODipine (NORVASC) 10 MG tablet    Sig: Take 1 tablet (10 mg total) by mouth once daily.    Dispense:  90 tablet    Refill:  1   apixaban (ELIQUIS) 5 MG TABS tablet    Sig: Take 1 tablet (5 mg total) by mouth 2 (two) times daily.    Dispense:  180 tablet    Refill:   1   atorvastatin (LIPITOR) 20 MG tablet    Sig: Take 1 tablet (20 mg total) by mouth daily.    Dispense:  90 tablet    Refill:  1   gabapentin (NEURONTIN) 300 MG capsule    Sig: Take 1 capsule (300 mg total) by mouth at bedtime.    Dispense:  90 capsule    Refill:  1    Follow-up: Return in about 6 months (around 08/08/2023) for Chronic medical conditions.       Hoy Register, MD, FAAFP. [facility] and Wellness [place], [place] [phone]   02/06/2023, 12:11 PM

## 2023-02-07 LAB — LP+NON-HDL CHOLESTEROL
Cholesterol, Total: 212 mg/dL — ABNORMAL HIGH (ref 100–199)
HDL: 51 mg/dL (ref >39)
LDL Chol Calc (NIH): 127 mg/dL — ABNORMAL HIGH (ref 0–99)
Total Non-HDL-Chol (LDL+VLDL): 161 mg/dL — ABNORMAL HIGH (ref 0–129)
Triglycerides: 194 mg/dL — ABNORMAL HIGH (ref 0–149)
VLDL Cholesterol Cal: 34 mg/dL (ref 5–40)

## 2023-02-07 LAB — CMP14+EGFR
ALT: 19 IU/L (ref 0–44)
AST: 32 IU/L (ref 0–40)
Albumin/Globulin Ratio: 1.4 (ref 1.2–2.2)
Albumin: 4.6 g/dL (ref 3.8–4.9)
Alkaline Phosphatase: 90 IU/L (ref 44–121)
BUN/Creatinine Ratio: 8 — ABNORMAL LOW (ref 9–20)
BUN: 10 mg/dL (ref 6–24)
Bilirubin Total: 0.9 mg/dL (ref 0.0–1.2)
CO2: 20 mmol/L (ref 20–29)
Calcium: 9.3 mg/dL (ref 8.7–10.2)
Chloride: 102 mmol/L (ref 96–106)
Creatinine, Ser: 1.18 mg/dL (ref 0.76–1.27)
Globulin, Total: 3.3 g/dL (ref 1.5–4.5)
Glucose: 88 mg/dL (ref 70–99)
Potassium: 4.6 mmol/L (ref 3.5–5.2)
Sodium: 137 mmol/L (ref 134–144)
Total Protein: 7.9 g/dL (ref 6.0–8.5)
eGFR: 73 mL/min/{1.73_m2} (ref >59)

## 2023-02-07 LAB — CBC WITH DIFFERENTIAL/PLATELET
Basophils Absolute: 0 10*3/uL (ref 0.0–0.2)
Basos: 0 %
EOS (ABSOLUTE): 0.1 10*3/uL (ref 0.0–0.4)
Eos: 3 %
Hematocrit: 45.5 % (ref 37.5–51.0)
Hemoglobin: 16.1 g/dL (ref 13.0–17.7)
Immature Grans (Abs): 0 10*3/uL (ref 0.0–0.1)
Immature Granulocytes: 0 %
Lymphocytes Absolute: 1.4 10*3/uL (ref 0.7–3.1)
Lymphs: 32 %
MCH: 35.6 pg — ABNORMAL HIGH (ref 26.6–33.0)
MCHC: 35.4 g/dL (ref 31.5–35.7)
MCV: 101 fL — ABNORMAL HIGH (ref 79–97)
Monocytes Absolute: 0.7 10*3/uL (ref 0.1–0.9)
Monocytes: 14 %
Neutrophils Absolute: 2.3 10*3/uL (ref 1.4–7.0)
Neutrophils: 51 %
Platelets: 181 10*3/uL (ref 150–450)
RBC: 4.52 x10E6/uL (ref 4.14–5.80)
RDW: 12.5 % (ref 11.6–15.4)
WBC: 4.5 10*3/uL (ref 3.4–10.8)

## 2023-02-07 LAB — VITAMIN B12: Vitamin B-12: 312 pg/mL (ref 232–1245)

## 2023-02-07 LAB — HEMOGLOBIN A1C
Est. average glucose Bld gHb Est-mCnc: 100 mg/dL
Hgb A1c MFr Bld: 5.1 % (ref 4.8–5.6)

## 2023-02-13 ENCOUNTER — Other Ambulatory Visit: Payer: Self-pay | Admitting: Family Medicine

## 2023-02-13 DIAGNOSIS — J9859 Other diseases of mediastinum, not elsewhere classified: Secondary | ICD-10-CM

## 2023-02-13 NOTE — Progress Notes (Signed)
Please inform him that Dr. Cliffton Asters requests a repeat CT prior to scheduling an appointment with him and I have ordered the CT for him.

## 2023-02-14 NOTE — Progress Notes (Signed)
Pt has been called and given appointment details.

## 2023-03-19 ENCOUNTER — Ambulatory Visit
Admission: RE | Admit: 2023-03-19 | Discharge: 2023-03-19 | Disposition: A | Discharge status: Home / Self Care | Payer: Medicare Other | Source: Ambulatory Visit | Attending: Family Medicine | Admitting: Family Medicine

## 2023-03-19 DIAGNOSIS — J9859 Other diseases of mediastinum, not elsewhere classified: Secondary | ICD-10-CM

## 2023-03-19 MED ORDER — IOPAMIDOL (ISOVUE-300) INJECTION 61%
75.0000 mL | Freq: Once | INTRAVENOUS | Status: AC | PRN
  Administered 2023-03-19: 75 mL via INTRAVENOUS

## 2023-03-21 NOTE — Progress Notes (Deleted)
      [address]       [place] [address]             [phone]                    Alex Antonio Sr. [place] Medical Record #[id number] Date of Birth: 20-Apr-1966  Referring: Hoy Register, MD Primary Care: Hoy Register, MD Primary Cardiologist: None  Chief Complaint:   No chief complaint on file.   History of Present Illness:    Alex Antonio Sr. 57 y.o. male ***    Referred to cardiothoracic surgery on different occasions but he never followed through Discussed that it is concerning that he could possibly have a malignancy and he needs to keep his appointment - Ambulatory referral to Cardiothoracic Surgery  Past Medical History:  Diagnosis Date   Kidney stone     No past surgical history on file.  No family history on file.   Social History   Tobacco Use  Smoking Status Some Days  Smokeless Tobacco Never    Social History   Substance and Sexual Activity  Alcohol Use No     No Known Allergies  Current Outpatient Medications  Medication Sig Dispense Refill   amLODipine (NORVASC) 10 MG tablet Take 1 tablet (10 mg total) by mouth once daily. 90 tablet 1   apixaban (ELIQUIS) 5 MG TABS tablet Take 1 tablet (5 mg total) by mouth 2 (two) times daily. 180 tablet 1   atorvastatin (LIPITOR) 20 MG tablet Take 1 tablet (20 mg total) by mouth daily. 90 tablet 1   gabapentin (NEURONTIN) 300 MG capsule Take 1 capsule (300 mg total) by mouth at bedtime. 90 capsule 1   Multiple Vitamins-Minerals (ONE-A-DAY MENS HEALTH FORMULA) TABS Take 1 tablet by mouth daily with breakfast.     No current facility-administered medications for this visit.    ROS  PHYSICAL EXAMINATION: There were no vitals taken for this visit.  Physical Exam   Diagnostic Studies & Laboratory data:     Recent Radiology Findings:   No results found.     I have independently reviewed the above radiology studies  and reviewed the findings with the patient.    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  Tumor markers will be ordered, along with a PET CT.  Tumor markers from 2022 were all negative.        Alex Glover 03/21/2023 9:13 AM

## 2023-03-23 ENCOUNTER — Other Ambulatory Visit: Payer: Self-pay | Admitting: Pharmacist

## 2023-03-23 ENCOUNTER — Encounter: Payer: Self-pay | Admitting: Thoracic Surgery (Cardiothoracic Vascular Surgery)

## 2023-03-23 ENCOUNTER — Encounter: Payer: Medicare Other | Admitting: Thoracic Surgery (Cardiothoracic Vascular Surgery)

## 2023-03-23 ENCOUNTER — Other Ambulatory Visit: Payer: Self-pay

## 2023-03-23 DIAGNOSIS — I82599 Chronic embolism and thrombosis of other specified deep vein of unspecified lower extremity: Secondary | ICD-10-CM

## 2023-03-23 MED ORDER — APIXABAN 5 MG PO TABS
5.0000 mg | ORAL_TABLET | Freq: Two times a day (BID) | ORAL | 1 refills | Status: AC
Start: 2023-03-23 — End: ?

## 2023-04-20 ENCOUNTER — Encounter: Payer: Medicare Other | Admitting: Thoracic Surgery (Cardiothoracic Vascular Surgery)

## 2023-05-01 ENCOUNTER — Other Ambulatory Visit: Payer: Self-pay | Admitting: Internal Medicine

## 2023-05-01 DIAGNOSIS — E041 Nontoxic single thyroid nodule: Secondary | ICD-10-CM

## 2023-05-09 ENCOUNTER — Encounter: Payer: Medicare Other | Admitting: Thoracic Surgery (Cardiothoracic Vascular Surgery)

## 2023-05-11 ENCOUNTER — Encounter: Payer: Medicare Other | Admitting: Thoracic Surgery (Cardiothoracic Vascular Surgery)

## 2023-08-09 ENCOUNTER — Ambulatory Visit: Payer: Medicare Other | Admitting: Family Medicine

## 2023-10-24 ENCOUNTER — Other Ambulatory Visit: Payer: Self-pay

## 2023-11-05 ENCOUNTER — Ambulatory Visit: Payer: Medicare Other | Admitting: Family Medicine

## 2023-11-13 ENCOUNTER — Ambulatory Visit: Payer: Medicare Other | Attending: Family Medicine | Admitting: Family Medicine

## 2023-11-13 ENCOUNTER — Encounter: Payer: Self-pay | Admitting: Family Medicine

## 2023-11-13 ENCOUNTER — Other Ambulatory Visit: Payer: Self-pay

## 2023-11-13 VITALS — BP 119/76 | HR 63 | Ht 73.0 in | Wt 191.2 lb

## 2023-11-13 DIAGNOSIS — I10 Essential (primary) hypertension: Secondary | ICD-10-CM | POA: Insufficient documentation

## 2023-11-13 DIAGNOSIS — E041 Nontoxic single thyroid nodule: Secondary | ICD-10-CM | POA: Diagnosis not present

## 2023-11-13 DIAGNOSIS — R202 Paresthesia of skin: Secondary | ICD-10-CM | POA: Diagnosis not present

## 2023-11-13 DIAGNOSIS — J439 Emphysema, unspecified: Secondary | ICD-10-CM | POA: Diagnosis not present

## 2023-11-13 DIAGNOSIS — Z7901 Long term (current) use of anticoagulants: Secondary | ICD-10-CM | POA: Diagnosis not present

## 2023-11-13 DIAGNOSIS — Z86711 Personal history of pulmonary embolism: Secondary | ICD-10-CM | POA: Insufficient documentation

## 2023-11-13 DIAGNOSIS — I82599 Chronic embolism and thrombosis of other specified deep vein of unspecified lower extremity: Secondary | ICD-10-CM | POA: Diagnosis not present

## 2023-11-13 DIAGNOSIS — R55 Syncope and collapse: Secondary | ICD-10-CM | POA: Insufficient documentation

## 2023-11-13 DIAGNOSIS — R296 Repeated falls: Secondary | ICD-10-CM | POA: Insufficient documentation

## 2023-11-13 DIAGNOSIS — R918 Other nonspecific abnormal finding of lung field: Secondary | ICD-10-CM | POA: Insufficient documentation

## 2023-11-13 DIAGNOSIS — Z86718 Personal history of other venous thrombosis and embolism: Secondary | ICD-10-CM | POA: Diagnosis not present

## 2023-11-13 DIAGNOSIS — R200 Anesthesia of skin: Secondary | ICD-10-CM | POA: Diagnosis present

## 2023-11-13 MED ORDER — ATORVASTATIN CALCIUM 20 MG PO TABS
20.0000 mg | ORAL_TABLET | Freq: Every day | ORAL | 1 refills | Status: DC
  Filled 2023-11-13: qty 90, 90d supply, fill #0

## 2023-11-13 MED ORDER — GABAPENTIN 300 MG PO CAPS
300.0000 mg | ORAL_CAPSULE | Freq: Every day | ORAL | 1 refills | Status: DC
Start: 2023-11-13 — End: 2024-07-28
  Filled 2023-11-13 – 2024-01-18 (×2): qty 90, 90d supply, fill #0
  Filled 2024-04-28: qty 90, 90d supply, fill #1

## 2023-11-13 MED ORDER — AMLODIPINE BESYLATE 5 MG PO TABS
5.0000 mg | ORAL_TABLET | Freq: Every day | ORAL | 1 refills | Status: AC
Start: 2023-11-13 — End: ?
  Filled 2023-11-13: qty 90, 90d supply, fill #0

## 2023-11-13 NOTE — Progress Notes (Addendum)
Subjective:  Patient ID: Alex Antonio Sr., male    DOB: April 03, 1966  Age: 58 y.o. MRN: [id number]  CC: Medical Management of Chronic Issues (Frequent falls)   HPI Alex WEIMAR Sr. is a 58 y.o. year old male with a history of hypertension, recurrent DVT and PE , thyroid nodule, mediastinal mass, Emphysema.   Interval History: Discussed the use of AI scribe software for clinical note transcription with the patient, who gave verbal consent to proceed.  He presents with a history of falls, which he dates back to a CT scan he had in June of the 2024 where he received contrast. He reports that these falls often occur when he is walking, and he has experienced three such incidents.  He states this begins with a sore sensation in his mouth as if he has lemon and then he feels himself going down and has to hold onto support and then blacks out.  He is unsure if this is associated with seizure episodes .  He also reports numbness in his toes and fingertips, which is not constant but occurs intermittently. He also experiences a sensation of weakness or exhaustion in his legs after walking for about 30-40 minutes, after which he needs to sit down for the rest of the day.  While mowing the lawn he has difficulty using the push mower.  He also reports a sensation of his leg jerking involuntarily, particularly when he is in bed. He has noticed a darkening of his skin on his legs, which he associates with swelling in his legs. He also reports a history of a Pfizer COVID-19 vaccination shot, which he associates with the onset of his health issues started with his DVT in 2022.   He has no chest pain or dyspnea.  He does have a mediastinal mass for which I had referred him to cardiothoracic surgery but he is yet to follow-up with them.   CT scan from 03/2023 revealed: IMPRESSION: 1. Similar appearance of nodular soft tissue of the anterior mediastinum with interdigitating fat. This is not  significantly changed since January 2022. 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 Recommend continued attention on follow-up. 2. Given underlying emphysema, recommend evaluation for candidacy for annual lung cancer screening CT.   Emphysema (ICD10-J43.9).   Past Medical History:  Diagnosis Date   Kidney stone     No past surgical history on file.  No family history on file.  Social History   Socioeconomic History   Marital status: Single    Spouse name: Not on file   Number of children: Not on file   Years of education: Not on file   Highest education level: Not on file  Occupational History   Not on file  Tobacco Use   Smoking status: Some Days   Smokeless tobacco: Never  Substance and Sexual Activity   Alcohol use: No   Drug use: No   Sexual activity: Not on file  Other Topics Concern   Not on file  Social History Narrative   Not on file   Social Drivers of Health   Financial Resource Strain: Medium Risk (11/13/2023)   Overall Financial Resource Strain (CARDIA)    Difficulty of Paying Living Expenses: Somewhat hard  Food Insecurity: Food Insecurity Present (11/13/2023)   Hunger Vital Sign    Worried About Running Out of Food in the Last Year: Often true    Ran Out of Food in the Last Year: Often true  Transportation  Needs: Unmet Transportation Needs (11/13/2023)   PRAPARE - Transportation    Lack of Transportation (Medical): Yes    Lack of Transportation (Non-Medical): Yes  Physical Activity: Sufficiently Active (11/13/2023)   Exercise Vital Sign    Days of Exercise per Week: 7 days    Minutes of Exercise per Session: 30 min  Stress: No Stress Concern Present (11/13/2023)   [name] of Occupational Health - Occupational Stress Questionnaire    Feeling of Stress : Not at all  Social Connections: Socially Isolated (11/13/2023)   Social Connection and Isolation Panel [NHANES]    Frequency of Communication with Friends and Family:  Once a week    Frequency of Social Gatherings with Friends and Family: Once a week    Attends Religious Services: Never    Database administrator or Organizations: No    Attends Engineer, structural: Never    Marital Status: Divorced    No Known Allergies  Outpatient Medications Prior to Visit  Medication Sig Dispense Refill   apixaban (ELIQUIS) 5 MG TABS tablet Take 1 tablet (5 mg total) by mouth 2 (two) times daily. 180 tablet 1   Multiple Vitamins-Minerals (ONE-A-DAY MENS HEALTH FORMULA) TABS Take 1 tablet by mouth daily with breakfast.     amLODipine (NORVASC) 10 MG tablet Take 1 tablet (10 mg total) by mouth once daily. 90 tablet 1   atorvastatin (LIPITOR) 20 MG tablet Take 1 tablet (20 mg total) by mouth daily. 90 tablet 1   gabapentin (NEURONTIN) 300 MG capsule Take 1 capsule (300 mg total) by mouth at bedtime. 90 capsule 1   No facility-administered medications prior to visit.     ROS Review of Systems  Constitutional:  Negative for activity change and appetite change.  HENT:  Negative for sinus pressure and sore throat.   Respiratory:  Negative for chest tightness, shortness of breath and wheezing.   Cardiovascular:  Negative for chest pain and palpitations.  Gastrointestinal:  Negative for abdominal distention, abdominal pain and constipation.  Genitourinary: Negative.   Musculoskeletal: Negative.   Psychiatric/Behavioral:  Negative for behavioral problems and dysphoric mood.     Objective:  BP 119/76   Pulse 63   Ht 6\' 1"  (1.854 m)   Wt 191 lb 3.2 oz (86.7 kg)   SpO2 100%   BMI 25.23 kg/m      11/13/2023    9:37 AM 02/06/2023   10:42 AM 07/31/2022    4:00 PM  BP/Weight  Systolic BP 119 127 124  Diastolic BP 76 83 82  Wt. (Lbs) 191.2 195.8 188.4  BMI 25.23 kg/m2 25.83 kg/m2 24.86 kg/m2      Physical Exam Constitutional:      Appearance: He is well-developed.  Cardiovascular:     Rate and Rhythm: Normal rate.     Heart sounds: Normal heart  sounds. No murmur heard. Pulmonary:     Effort: Pulmonary effort is normal.     Breath sounds: Normal breath sounds. No wheezing or rales.  Chest:     Chest wall: No tenderness.  Abdominal:     General: Bowel sounds are normal. There is no distension.     Palpations: Abdomen is soft. There is no mass.     Tenderness: There is no abdominal tenderness.  Musculoskeletal:        General: Normal range of motion.     Right lower leg: No edema.     Left lower leg: No edema.  Neurological:  Mental Status: He is alert and oriented to person, place, and time.     Cranial Nerves: Cranial nerves 2-12 are intact.     Motor: Motor function is intact.     Coordination: Coordination is intact.     Gait: Gait is intact.  Psychiatric:        Mood and Affect: Mood normal.        Latest Ref Rng & Units 02/06/2023   11:33 AM 04/26/2022   12:10 PM 01/20/2021    2:52 PM  CMP  Glucose 70 - 99 mg/dL 88  82    BUN 6 - 24 mg/dL 10  6    Creatinine 0.86 - 1.27 mg/dL 5.78  4.69  6.29   Sodium 134 - 144 mmol/L 137  143    Potassium 3.5 - 5.2 mmol/L 4.6  4.5    Chloride 96 - 106 mmol/L 102  104    CO2 20 - 29 mmol/L 20  19    Calcium 8.7 - 10.2 mg/dL 9.3  9.5    Total Protein 6.0 - 8.5 g/dL 7.9  7.8    Total Bilirubin 0.0 - 1.2 mg/dL 0.9  0.4    Alkaline Phos 44 - 121 IU/L 90  79    AST 0 - 40 IU/L 32  16    ALT 0 - 44 IU/L 19  9      Lipid Panel     Component Value Date/Time   CHOL 212 (H) 02/06/2023 1133   TRIG 194 (H) 02/06/2023 1133   HDL 51 02/06/2023 1133   LDLCALC 127 (H) 02/06/2023 1133    CBC    Component Value Date/Time   WBC 4.5 02/06/2023 1133   WBC 6.6 11/06/2020 0255   RBC 4.52 02/06/2023 1133   RBC 3.98 (L) 11/06/2020 0255   HGB 16.1 02/06/2023 1133   HCT 45.5 02/06/2023 1133   PLT 181 02/06/2023 1133   MCV 101 (H) 02/06/2023 1133   MCH 35.6 (H) 02/06/2023 1133   MCH 34.9 (H) 11/06/2020 0255   MCHC 35.4 02/06/2023 1133   MCHC 34.2 11/06/2020 0255   RDW 12.5  02/06/2023 1133   LYMPHSABS 1.4 02/06/2023 1133   EOSABS 0.1 02/06/2023 1133   BASOSABS 0.0 02/06/2023 1133    Lab Results  Component Value Date   HGBA1C 5.1 02/06/2023    Assessment & Plan:      Syncope/recurrent falls Multiple episodes of falls with associated numbness in toes and fingers. Patient reports a premonitory sensation prior to falls. Unknown loss of consciousness or seizure-like activity.  No associated dizziness, headaches, or blurry vision. -Reduce Amlodipine dose due to potential orthostatic hypotension. -Order CT scan of the head without contrast to rule out intracranial pathology. -Order Carotid Doppler to rule out carotid stenosis. -Refer to Neurology for further evaluation of potential neurological causes. -EKG reveals new T wave inversion in anterior leads-refer to cardiology for possible stress test  Emphysema Chronic smoking history with radiographic evidence of emphysema. -Encourage continued smoking cessation.  Anterior Mediastinal Mass Stable nodular tissue in the anterior mediastinum on prior imaging. -Recommend follow-up with Dr. Cliffton Asters for further evaluation and management.  Chronic DVT/PE -Remains on lifelong anticoagulation with Eliquis  General Health Maintenance -Declined influenza vaccination. -Follow-up in 3 months to reassess symptoms and review results of diagnostic studies.          Meds ordered this encounter  Medications   amLODipine (NORVASC) 5 MG tablet    Sig: Take 1 tablet (  5 mg total) by mouth daily.    Dispense:  90 tablet    Refill:  1    Dose decrease   atorvastatin (LIPITOR) 20 MG tablet    Sig: Take 1 tablet (20 mg total) by mouth daily.    Dispense:  90 tablet    Refill:  1   gabapentin (NEURONTIN) 300 MG capsule    Sig: Take 1 capsule (300 mg total) by mouth at bedtime.    Dispense:  90 capsule    Refill:  1    Follow-up: Return in about 3 months (around 02/11/2024) for Chronic medical conditions.        Hoy Register, MD, FAAFP. [facility] and Wellness [place], [place] [phone]   11/13/2023, 1:10 PM

## 2023-11-13 NOTE — Patient Instructions (Signed)
VISIT SUMMARY:  During today's visit, we discussed your recent history of falls, numbness in your toes and fingers, leg weakness, and other symptoms. We also reviewed your history of emphysema and the stable anterior mediastinal mass. We have made several adjustments to your treatment plan and ordered further tests to better understand your symptoms.  YOUR PLAN:  -RECURRENT FALLS: Recurrent falls can be due to various reasons, including issues with blood pressure, neurological problems, or other underlying conditions. We have reduced your Amlodipine dose to see if it helps with potential blood pressure-related issues. Additionally, we have ordered a CT scan of your head and a Carotid Doppler to check for any problems in your brain or neck arteries. You will also be referred to a neurologist for further evaluation.  -EMPHYSEMA: Emphysema is a lung condition that causes shortness of breath and is often related to smoking. It is important to continue avoiding smoking to prevent further damage to your lungs.  -ANTERIOR MEDIASTINAL MASS: An anterior mediastinal mass is a growth in the area of your chest between your lungs. We recommend following up with Dr. Cliffton Asters for further evaluation and management of this condition.  -GENERAL HEALTH MAINTENANCE: You declined the influenza vaccination. We will follow up in 3 months to reassess your symptoms and review the results of the diagnostic studies.  INSTRUCTIONS:  Please follow up in 3 months to reassess your symptoms and review the results of the diagnostic studies. Make sure to complete the CT scan of your head and the Carotid Doppler as soon as possible. Additionally, attend the neurology referral for further evaluation.

## 2023-11-14 ENCOUNTER — Encounter: Payer: Self-pay | Admitting: Family Medicine

## 2023-11-14 ENCOUNTER — Other Ambulatory Visit: Payer: Self-pay

## 2023-11-14 ENCOUNTER — Other Ambulatory Visit: Payer: Self-pay | Admitting: Family Medicine

## 2023-11-14 LAB — CMP14+EGFR
ALT: 13 [IU]/L (ref 0–44)
AST: 23 [IU]/L (ref 0–40)
Albumin: 4.4 g/dL (ref 3.8–4.9)
Alkaline Phosphatase: 75 [IU]/L (ref 44–121)
BUN/Creatinine Ratio: 9 (ref 9–20)
BUN: 10 mg/dL (ref 6–24)
Bilirubin Total: 0.6 mg/dL (ref 0.0–1.2)
CO2: 22 mmol/L (ref 20–29)
Calcium: 9.6 mg/dL (ref 8.7–10.2)
Chloride: 102 mmol/L (ref 96–106)
Creatinine, Ser: 1.13 mg/dL (ref 0.76–1.27)
Globulin, Total: 3.5 g/dL (ref 1.5–4.5)
Glucose: 88 mg/dL (ref 70–99)
Potassium: 4.9 mmol/L (ref 3.5–5.2)
Sodium: 139 mmol/L (ref 134–144)
Total Protein: 7.9 g/dL (ref 6.0–8.5)
eGFR: 77 mL/min/{1.73_m2} (ref >59)

## 2023-11-14 LAB — CBC WITH DIFFERENTIAL/PLATELET
Basophils Absolute: 0 10*3/uL (ref 0.0–0.2)
Basos: 1 %
EOS (ABSOLUTE): 0.1 10*3/uL (ref 0.0–0.4)
Eos: 3 %
Hematocrit: 46.5 % (ref 37.5–51.0)
Hemoglobin: 15.7 g/dL (ref 13.0–17.7)
Immature Grans (Abs): 0 10*3/uL (ref 0.0–0.1)
Immature Granulocytes: 0 %
Lymphocytes Absolute: 1.6 10*3/uL (ref 0.7–3.1)
Lymphs: 34 %
MCH: 34.4 pg — ABNORMAL HIGH (ref 26.6–33.0)
MCHC: 33.8 g/dL (ref 31.5–35.7)
MCV: 102 fL — ABNORMAL HIGH (ref 79–97)
Monocytes Absolute: 0.7 10*3/uL (ref 0.1–0.9)
Monocytes: 14 %
Neutrophils Absolute: 2.3 10*3/uL (ref 1.4–7.0)
Neutrophils: 48 %
Platelets: 192 10*3/uL (ref 150–450)
RBC: 4.56 x10E6/uL (ref 4.14–5.80)
RDW: 12.8 % (ref 11.6–15.4)
WBC: 4.7 10*3/uL (ref 3.4–10.8)

## 2023-11-14 LAB — LP+NON-HDL CHOLESTEROL
Cholesterol, Total: 204 mg/dL — ABNORMAL HIGH (ref 100–199)
HDL: 40 mg/dL (ref >39)
LDL Chol Calc (NIH): 143 mg/dL — ABNORMAL HIGH (ref 0–99)
Total Non-HDL-Chol (LDL+VLDL): 164 mg/dL — ABNORMAL HIGH (ref 0–129)
Triglycerides: 113 mg/dL (ref 0–149)
VLDL Cholesterol Cal: 21 mg/dL (ref 5–40)

## 2023-11-14 MED ORDER — ATORVASTATIN CALCIUM 40 MG PO TABS
40.0000 mg | ORAL_TABLET | Freq: Every day | ORAL | 1 refills | Status: AC
  Filled 2023-11-14: qty 90, 90d supply, fill #0

## 2023-11-20 ENCOUNTER — Ambulatory Visit (HOSPITAL_COMMUNITY): Payer: Medicare Other

## 2023-11-20 ENCOUNTER — Ambulatory Visit: Payer: Medicare Other | Attending: Family Medicine

## 2023-11-20 VITALS — Ht 73.0 in | Wt 191.0 lb

## 2023-11-20 DIAGNOSIS — Z5986 Financial insecurity: Secondary | ICD-10-CM | POA: Diagnosis not present

## 2023-11-20 DIAGNOSIS — Z0000 Encounter for general adult medical examination without abnormal findings: Secondary | ICD-10-CM | POA: Diagnosis not present

## 2023-11-20 DIAGNOSIS — Z5941 Food insecurity: Secondary | ICD-10-CM | POA: Diagnosis not present

## 2023-11-20 DIAGNOSIS — Z5982 Transportation insecurity: Secondary | ICD-10-CM | POA: Diagnosis not present

## 2023-11-20 NOTE — Patient Instructions (Addendum)
Alex Glover , Thank you for taking time to come for your Medicare Wellness Visit. I appreciate your ongoing commitment to your health goals. Please review the following plan we discussed and let me know if I can assist you in the future.   Referrals/Orders/Follow-Ups/Clinician Recommendations: Yes; a referral has been placed for RN Management due to your current conditions and instabilities.  This is a list of the screening recommended for you and due dates:  Health Maintenance  Topic Date Due   Zoster (Shingles) Vaccine (1 of 2) Never done   COVID-19 Vaccine (4 - 2024-25 season) 06/17/2023   Flu Shot  01/14/2024*   Pneumococcal Vaccination (1 of 2 - PCV) 11/12/2024*   Cologuard (Stool DNA test)  07/07/2024   Medicare Annual Wellness Visit  11/19/2024   Hepatitis C Screening  Completed   HIV Screening  Completed   HPV Vaccine  Aged Out   DTaP/Tdap/Td vaccine  Discontinued  *Topic was postponed. The date shown is not the original due date.    Advanced directives: (Declined) Advance directive discussed with you today. Even though you declined this today, please call our office should you change your mind, and we can give you the proper paperwork for you to fill out.  Next Medicare Annual Wellness Visit scheduled for next year: Yes; It was nice speaking with you today!  Your next scheduled Medicare Wellness Visit is scheduled with Nurse Percell Miller, via telephone on 11/25/2024 at 3:40 p.m. If you need to cancel or reschedule please call [phone].

## 2023-11-20 NOTE — Progress Notes (Addendum)
 Subjective:   Alex LITTIE Hamilton Sr. is a 58 y.o. male who presents for an Initial Medicare Annual Wellness Visit.  Visit Complete: Virtual I connected with  Alex LITTIE Hamilton Sr. on 11/20/23 by a audio enabled telemedicine application and verified that I am speaking with the correct person using two identifiers.  Patient Location: Home  Provider Location: Office/Clinic  I discussed the limitations of evaluation and management by telemedicine. The patient expressed understanding and agreed to proceed.  Vital Signs: Because this visit was a virtual/telehealth visit, some criteria may be missing or patient reported. Any vitals not documented were not able to be obtained and vitals that have been documented are patient reported.  This patient declined Interactive audio and acupuncturist. Therefore the visit was completed with audio only.  This patient declined Interactive audio and acupuncturist. Therefore the visit was completed with audio only.  Cardiac Risk Factors include: advanced age (>30men, >79 women);male gender     Objective:    Today's Vitals   11/20/23 1547  Weight: 191 lb (86.6 kg)  Height: 6' 1 (1.854 m)  PainSc: 1   PainLoc: Leg   Body mass index is 25.2 kg/m.     11/20/2023    3:50 PM 05/31/2022   11:40 AM 11/04/2020    9:35 AM  Advanced Directives  Does Patient Have a Medical Advance Directive? No No No  Would patient like information on creating a medical advance directive? No - Patient declined Yes (MAU/Ambulatory/Procedural Areas - Information given) No - Patient declined    Current Medications (verified) Outpatient Encounter Medications as of 11/20/2023  Medication Sig   amLODipine  (NORVASC ) 5 MG tablet Take 1 tablet (5 mg total) by mouth daily.   apixaban  (ELIQUIS ) 5 MG TABS tablet Take 1 tablet (5 mg total) by mouth 2 (two) times daily.   atorvastatin  (LIPITOR) 40 MG tablet Take 1 tablet (40 mg total) by mouth daily.   gabapentin   (NEURONTIN ) 300 MG capsule Take 1 capsule (300 mg total) by mouth at bedtime.   Multiple Vitamins-Minerals (ONE-A-DAY MENS HEALTH FORMULA) TABS Take 1 tablet by mouth daily with breakfast.   No facility-administered encounter medications on file as of 11/20/2023.    Allergies (verified) Patient has no known allergies.   History: Past Medical History:  Diagnosis Date   Kidney stone    No past surgical history on file. No family history on file. Social History   Socioeconomic History   Marital status: Single    Spouse name: Not on file   Number of children: Not on file   Years of education: Not on file   Highest education level: Not on file  Occupational History   Not on file  Tobacco Use   Smoking status: Some Days   Smokeless tobacco: Never  Substance and Sexual Activity   Alcohol use: No   Drug use: No   Sexual activity: Not on file  Other Topics Concern   Not on file  Social History Narrative   Not on file   Social Drivers of Health   Financial Resource Strain: Medium Risk (11/20/2023)   Overall Financial Resource Strain (CARDIA)    Difficulty of Paying Living Expenses: Somewhat hard  Food Insecurity: Food Insecurity Present (11/20/2023)   Hunger Vital Sign    Worried About Running Out of Food in the Last Year: Often true    Ran Out of Food in the Last Year: Often true  Transportation Needs: Unmet Transportation Needs (11/20/2023)  PRAPARE - Administrator, Civil Service (Medical): Yes    Lack of Transportation (Non-Medical): Yes  Physical Activity: Sufficiently Active (11/20/2023)   Exercise Vital Sign    Days of Exercise per Week: 7 days    Minutes of Exercise per Session: 30 min  Stress: No Stress Concern Present (11/20/2023)   [name] of Occupational Health - Occupational Stress Questionnaire    Feeling of Stress : Not at all  Social Connections: Socially Isolated (11/20/2023)   Social Connection and Isolation Panel [NHANES]    Frequency of  Communication with Friends and Family: Once a week    Frequency of Social Gatherings with Friends and Family: Once a week    Attends Religious Services: Never    Database Administrator or Organizations: No    Attends Engineer, Structural: Never    Marital Status: Divorced    Tobacco Counseling Ready to quit: Not Answered Counseling given: Not Answered   Clinical Intake:  Pre-visit preparation completed: Yes  Pain : 0-10 Pain Score: 1  Pain Type: Neuropathic pain (bilateral legs after blood clot) Pain Location: Leg Pain Orientation: Left, Right Pain Descriptors / Indicators: Aching, Discomfort     BMI - recorded: 25.2 Nutritional Status: BMI 25 -29 Overweight Nutritional Risks: None Diabetes: No  How often do you need to have someone help you when you read instructions, pamphlets, or other written materials from your doctor or pharmacy?: 1 - Never What is the last grade level you completed in school?: HSG  Interpreter Needed?: No  Information entered by :: Alex Creary N. Ester Mabe, LPN.   Activities of Daily Living    11/20/2023    3:52 PM  In your present state of health, do you have any difficulty performing the following activities:  Hearing? 0  Vision? 0  Difficulty concentrating or making decisions? 0  Walking or climbing stairs? 0  Dressing or bathing? 0  Doing errands, shopping? 0  Preparing Food and eating ? N  Using the Toilet? N  In the past six months, have you accidently leaked urine? N  Do you have problems with loss of bowel control? N  Managing your Medications? N  Managing your Finances? N  Housekeeping or managing your Housekeeping? N    Patient Care Team: Delbert Clam, MD as PCP - General (Family Medicine)  Indicate any recent Medical Services you may have received from other than [facility] providers in the past year (date may be approximate).     Assessment:   This is a routine wellness examination for Alex Glover.  Hearing/Vision  screen Hearing Screening - Comments:: Denies hearing difficulties.  Vision Screening - Comments:: Wears OTC reading glasses - not up to date with routine eye exams with optometrist.    Goals Addressed             This Visit's Progress    Client understands the importance of follow-up with providers by attending scheduled visits.        Depression Screen    11/20/2023    4:03 PM 11/13/2023    9:42 AM 02/06/2023   10:45 AM 07/31/2022    4:02 PM 04/26/2022   11:12 AM 06/23/2021    9:06 AM 03/16/2021    9:31 AM  PHQ 2/9 Scores  PHQ - 2 Score 5 5 1  0 0 0 0  PHQ- 9 Score 5 5 3  2  0 0    Fall Risk    11/20/2023    4:06 PM  11/13/2023    9:38 AM 02/06/2023   10:45 AM 07/31/2022    4:02 PM 04/26/2022   11:08 AM  Fall Risk   Falls in the past year? 1 1 0 0 0  Number falls in past yr: 1 1 0 0 0  Injury with Fall? 1 1 0 0 0  Risk for fall due to : History of fall(s) History of fall(s) No Fall Risks No Fall Risks   Follow up Education provided;Falls prevention discussed;Falls evaluation completed Falls evaluation completed       MEDICARE RISK AT HOME: Medicare Risk at Home Any stairs in or around the home?: No If so, are there any without handrails?: No Home free of loose throw rugs in walkways, pet beds, electrical cords, etc?: Yes Adequate lighting in your home to reduce risk of falls?: Yes Life alert?: No Use of a cane, walker or w/c?: No Grab bars in the bathroom?: No Shower chair or bench in shower?: No Elevated toilet seat or a handicapped toilet?: No  TIMED UP AND GO:  Was the test performed? No    Cognitive Function:    11/20/2023    3:51 PM  MMSE - Mini Mental State Exam  Not completed: Unable to complete        11/20/2023    3:51 PM  6CIT Screen  What Year? 0 points  What month? 0 points  What time? 0 points  Count back from 20 0 points  Months in reverse 0 points  Repeat phrase 0 points  Total Score 0 points    Immunizations Immunization History   Administered Date(s) Administered   PFIZER(Purple Top)SARS-COV-2 Vaccination 02/25/2020, 03/22/2020, 10/22/2020    Tdap status: Declined, Education has been provided regarding the importance of this vaccine but patient still declined. Advised may receive this vaccine at local pharmacy or Health Dept. Aware to provide a copy of the vaccination record if obtained from local pharmacy or Health Dept. Verbalized acceptance and understanding.  Flu Vaccine status: Declined, Education has been provided regarding the importance of this vaccine but patient still declined. Advised may receive this vaccine at local pharmacy or Health Dept. Aware to provide a copy of the vaccination record if obtained from local pharmacy or Health Dept. Verbalized acceptance and understanding.  Pneumococcal vaccine status: Declined,  Education has been provided regarding the importance of this vaccine but patient still declined. Advised may receive this vaccine at local pharmacy or Health Dept. Aware to provide a copy of the vaccination record if obtained from local pharmacy or Health Dept. Verbalized acceptance and understanding.   Covid-19 vaccine status: Completed vaccines  Qualifies for Shingles Vaccine? Yes   Zostavax completed No   Shingrix Completed?: No.    Education has been provided regarding the importance of this vaccine. Patient has been advised to call insurance company to determine out of pocket expense if they have not yet received this vaccine. Advised may also receive vaccine at local pharmacy or Health Dept. Verbalized acceptance and understanding.  Screening Tests Health Maintenance  Topic Date Due   Zoster Vaccines- Shingrix (1 of 2) Never done   COVID-19 Vaccine (4 - 2024-25 season) 06/17/2023   INFLUENZA VACCINE  01/14/2024 (Originally 05/17/2023)   Pneumococcal Vaccine 58-56 Years old (1 of 2 - PCV) 11/12/2024 (Originally 10/10/1972)   Fecal DNA (Cologuard)  07/07/2024   Medicare Annual Wellness  (AWV)  11/19/2024   Hepatitis C Screening  Completed   HIV Screening  Completed   HPV VACCINES  Aged Out   DTaP/Tdap/Td  Discontinued    Health Maintenance  Health Maintenance Due  Topic Date Due   Zoster Vaccines- Shingrix (1 of 2) Never done   COVID-19 Vaccine (4 - 2024-25 season) 06/17/2023    Colorectal cancer screening: Type of screening: Cologuard. Completed 07/07/2021. Repeat every 3 years  Lung Cancer Screening: (Low Dose CT Chest recommended if Age 67-80 years, 20 pack-year currently smoking OR have quit w/in 15years.) does not qualify.   Lung Cancer Screening Referral: no  Additional Screening:  Hepatitis C Screening: does qualify; Completed 04/26/2022  Vision Screening: Recommended annual ophthalmology exams for early detection of glaucoma and other disorders of the eye. Is the patient up to date with their annual eye exam?  No  Who is the provider or what is the name of the office in which the patient attends annual eye exams? None If pt is not established with a provider, would they like to be referred to a provider to establish care? No .   Dental Screening: Recommended annual dental exams for proper oral hygiene   Community Resource Referral / Chronic Care Management: CRR required this visit?  Yes   CCM required this visit?  PCP informed of CCM need    Plan:     I have personally reviewed and noted the following in the patient's chart:   Medical and social history Use of alcohol, tobacco or illicit drugs  Current medications and supplements including opioid prescriptions. Patient is not currently taking opioid prescriptions. Functional ability and status Nutritional status Physical activity Advanced directives List of other physicians Hospitalizations, surgeries, and ER visits in previous 12 months Vitals Screenings to include cognitive, depression, and falls Referrals and appointments  In addition, I have reviewed and discussed with patient  certain preventive protocols, quality metrics, and best practice recommendations. A written personalized care plan for preventive services as well as general preventive health recommendations were provided to patient.     Roz LOISE Fuller, LPN   04/19/7973   After Visit Summary: (Mail) Due to this being a telephonic visit, the after visit summary with patients personalized plan was offered to patient via mail   Nurse Notes: This nurse sent ambulatory referral for financial insecurity, transportation needs and food insecurity.

## 2023-11-21 ENCOUNTER — Telehealth: Payer: Self-pay | Admitting: *Deleted

## 2023-11-21 NOTE — Progress Notes (Signed)
 Complex Care Management Note Care Guide Note  11/21/2023 Name: Alex THROGMORTON Sr. MRN: [id number] DOB: [birth year]   Complex Care Management Outreach Attempts: An unsuccessful telephone outreach was attempted today to offer the patient information about available complex care management services.  Follow Up Plan:  Additional outreach attempts will be made to offer the patient complex care management information and services.   Encounter Outcome:  No Answer  Harlene Satterfield  [facility]  [facility], [facility] Guide  Direct Dial: [phone]  Fax [phone]

## 2023-11-23 NOTE — Progress Notes (Signed)
 Complex Care Management Note Care Guide Note  11/23/2023 Name: JAIZON DEROOS Sr. MRN: [id number] DOB: 18-Jan-1966   Complex Care Management Outreach Attempts: A second unsuccessful outreach was attempted today to offer the patient with information about available complex care management services.  Follow Up Plan:  Additional outreach attempts will be made to offer the patient complex care management information and services.   Encounter Outcome:  No Answer  Harlene Satterfield  [facility]  [facility], [facility] Guide  Direct Dial: [phone]  Fax [phone]

## 2023-11-26 NOTE — Progress Notes (Signed)
 Complex Care Management Note Care Guide Note  11/26/2023 Name: Alex OWUSU Sr. MRN: [id number] DOB: 12/05/1965   Complex Care Management Outreach Attempts: A third unsuccessful outreach was attempted today to offer the patient with information about available complex care management services.  Follow Up Plan:  No further outreach attempts will be made at this time. We have been unable to contact the patient to offer or enroll patient in complex care management services.  Encounter Outcome:  No Answer  Barnie Bora  [facility]  [facility], [facility] Guide  Direct Dial: [phone]  Fax [phone]'

## 2024-01-18 ENCOUNTER — Other Ambulatory Visit: Payer: Self-pay

## 2024-01-18 ENCOUNTER — Other Ambulatory Visit (HOSPITAL_COMMUNITY): Payer: Self-pay

## 2024-01-21 ENCOUNTER — Other Ambulatory Visit: Payer: Self-pay

## 2024-02-11 ENCOUNTER — Ambulatory Visit: Payer: Medicare Other | Admitting: Family Medicine

## 2024-04-28 ENCOUNTER — Other Ambulatory Visit: Payer: Self-pay

## 2024-04-29 ENCOUNTER — Other Ambulatory Visit: Payer: Self-pay

## 2024-07-28 ENCOUNTER — Other Ambulatory Visit: Payer: Self-pay | Admitting: Family Medicine

## 2024-07-28 ENCOUNTER — Other Ambulatory Visit: Payer: Self-pay

## 2024-07-28 DIAGNOSIS — R202 Paresthesia of skin: Secondary | ICD-10-CM

## 2024-07-28 MED ORDER — GABAPENTIN 300 MG PO CAPS
300.0000 mg | ORAL_CAPSULE | Freq: Every day | ORAL | 1 refills | Status: AC
Start: 2024-07-28 — End: ?
  Filled 2024-07-28: qty 90, 90d supply, fill #0

## 2024-07-30 ENCOUNTER — Other Ambulatory Visit: Payer: Self-pay

## 2024-11-25 ENCOUNTER — Ambulatory Visit: Payer: Medicare Other
